# Patient Record
Sex: Male | Born: 1970 | Race: Black or African American | Hispanic: No | Marital: Single | Smoking: Former smoker
Health system: Southern US, Community
[De-identification: ages and names within clinical notes are randomized; demographics above are authoritative.]

## PROBLEM LIST (undated history)

## (undated) DIAGNOSIS — Z789 Other specified health status: Secondary | ICD-10-CM

## (undated) DIAGNOSIS — S52501A Unspecified fracture of the lower end of right radius, initial encounter for closed fracture: Secondary | ICD-10-CM

## (undated) DIAGNOSIS — N400 Enlarged prostate without lower urinary tract symptoms: Secondary | ICD-10-CM

## (undated) HISTORY — DX: Benign prostatic hyperplasia without lower urinary tract symptoms: N40.0

## (undated) HISTORY — PX: COLONOSCOPY: SHX174

## (undated) HISTORY — PX: HERNIA REPAIR: SHX51

---

## 2003-11-05 ENCOUNTER — Emergency Department (HOSPITAL_COMMUNITY): Admission: EM | Admit: 2003-11-05 | Discharge: 2003-11-05 | Payer: Self-pay | Admitting: Emergency Medicine

## 2016-05-03 ENCOUNTER — Emergency Department (HOSPITAL_COMMUNITY)
Admission: EM | Admit: 2016-05-03 | Discharge: 2016-05-04 | Disposition: A | Discharge status: Home / Self Care | Payer: BLUE CROSS/BLUE SHIELD | Attending: Emergency Medicine | Admitting: Emergency Medicine

## 2016-05-03 ENCOUNTER — Emergency Department (HOSPITAL_COMMUNITY): Payer: BLUE CROSS/BLUE SHIELD

## 2016-05-03 ENCOUNTER — Encounter (HOSPITAL_COMMUNITY): Payer: Self-pay | Admitting: Emergency Medicine

## 2016-05-03 DIAGNOSIS — F17210 Nicotine dependence, cigarettes, uncomplicated: Secondary | ICD-10-CM | POA: Diagnosis not present

## 2016-05-03 DIAGNOSIS — R1084 Generalized abdominal pain: Secondary | ICD-10-CM | POA: Diagnosis present

## 2016-05-03 DIAGNOSIS — K529 Noninfective gastroenteritis and colitis, unspecified: Secondary | ICD-10-CM | POA: Diagnosis not present

## 2016-05-03 LAB — COMPREHENSIVE METABOLIC PANEL
ALT: 20 U/L (ref 17–63)
AST: 20 U/L (ref 15–41)
Albumin: 4.8 g/dL (ref 3.5–5.0)
Alkaline Phosphatase: 65 U/L (ref 38–126)
Anion gap: 8 (ref 5–15)
BUN: 14 mg/dL (ref 6–20)
CHLORIDE: 100 mmol/L — AB (ref 101–111)
CO2: 29 mmol/L (ref 22–32)
Calcium: 9.6 mg/dL (ref 8.9–10.3)
Creatinine, Ser: 1.19 mg/dL (ref 0.61–1.24)
Glucose, Bld: 96 mg/dL (ref 65–99)
POTASSIUM: 4 mmol/L (ref 3.5–5.1)
SODIUM: 137 mmol/L (ref 135–145)
Total Bilirubin: 1 mg/dL (ref 0.3–1.2)
Total Protein: 8.1 g/dL (ref 6.5–8.1)

## 2016-05-03 LAB — CBC
HEMATOCRIT: 48.1 % (ref 39.0–52.0)
Hemoglobin: 16.5 g/dL (ref 13.0–17.0)
MCH: 31 pg (ref 26.0–34.0)
MCHC: 34.3 g/dL (ref 30.0–36.0)
MCV: 90.4 fL (ref 78.0–100.0)
Platelets: 269 10*3/uL (ref 150–400)
RBC: 5.32 MIL/uL (ref 4.22–5.81)
RDW: 12.4 % (ref 11.5–15.5)
WBC: 10.7 10*3/uL — AB (ref 4.0–10.5)

## 2016-05-03 LAB — URINALYSIS, ROUTINE W REFLEX MICROSCOPIC
Bilirubin Urine: NEGATIVE
GLUCOSE, UA: NEGATIVE mg/dL
Ketones, ur: 15 mg/dL — AB
LEUKOCYTES UA: NEGATIVE
Nitrite: NEGATIVE
PH: 6 (ref 5.0–8.0)
Protein, ur: 100 mg/dL — AB
Specific Gravity, Urine: >1.03 — ABNORMAL HIGH (ref 1.005–1.030)

## 2016-05-03 LAB — URINE MICROSCOPIC-ADD ON

## 2016-05-03 LAB — TYPE AND SCREEN
ABO/RH(D): B POS
Antibody Screen: NEGATIVE

## 2016-05-03 LAB — LIPASE, BLOOD: LIPASE: 23 U/L (ref 11–51)

## 2016-05-03 MED ORDER — CIPROFLOXACIN IN D5W 400 MG/200ML IV SOLN
400.0000 mg | Freq: Once | INTRAVENOUS | Status: AC
  Administered 2016-05-03: 400 mg via INTRAVENOUS
  Filled 2016-05-03: qty 200

## 2016-05-03 MED ORDER — DIATRIZOATE MEGLUMINE & SODIUM 66-10 % PO SOLN
ORAL | Status: AC
  Filled 2016-05-03: qty 30

## 2016-05-03 MED ORDER — IOPAMIDOL (ISOVUE-300) INJECTION 61%
100.0000 mL | Freq: Once | INTRAVENOUS | Status: AC | PRN
  Administered 2016-05-03: 100 mL via INTRAVENOUS

## 2016-05-03 MED ORDER — TRAMADOL HCL 50 MG PO TABS: 50.0000 mg | ORAL_TABLET | Freq: Four times a day (QID) | ORAL | Status: DC | PRN

## 2016-05-03 MED ORDER — DIPHENHYDRAMINE HCL 50 MG/ML IJ SOLN: 12.5000 mg | Freq: Once | INTRAMUSCULAR | Status: DC

## 2016-05-03 MED ORDER — CIPROFLOXACIN HCL 500 MG PO TABS: ORAL_TABLET | ORAL | Status: DC

## 2016-05-03 MED ORDER — ONDANSETRON 4 MG PO TBDP: ORAL_TABLET | ORAL | Status: DC

## 2016-05-03 MED ORDER — METRONIDAZOLE IN NACL 5-0.79 MG/ML-% IV SOLN
500.0000 mg | Freq: Once | INTRAVENOUS | Status: AC
  Administered 2016-05-03: 500 mg via INTRAVENOUS
  Filled 2016-05-03: qty 100

## 2016-05-03 MED ORDER — METRONIDAZOLE 500 MG PO TABS: 500.0000 mg | ORAL_TABLET | Freq: Four times a day (QID) | ORAL | Status: DC

## 2016-05-03 NOTE — ED Notes (Addendum)
Pt reports severe intermittent abdominal pain with dry heaves, diarrhea, and bright red blood in stools. Pt has hx of hemorrhoids.

## 2016-05-03 NOTE — ED Provider Notes (Signed)
CSN: [id number]     Arrival date & time 05/03/16  1651 History   First MD Initiated Contact with Patient 05/03/16 1918     Chief Complaint  Patient presents with  . Abdominal Pain     (Consider location/radiation/quality/duration/timing/severity/associated sxs/prior Treatment) Patient is a 45 y.o. male presenting with abdominal pain. The history is provided by the patient (Patient complains of abdominal pain with some nausea and bleeding per rectum).  Abdominal Pain Pain location:  Generalized Pain quality: aching   Pain radiates to:  Periumbilical region Pain severity:  Moderate Onset quality:  Gradual Timing:  Intermittent Progression:  Waxing and waning Associated symptoms: diarrhea   Associated symptoms: no chest pain, no cough, no fatigue and no hematuria     History reviewed. No pertinent past medical history. Past Surgical History  Procedure Laterality Date  . Hernia repair     No family history on file. Social History  Substance Use Topics  . Smoking status: Current Every Day Smoker -- 0.50 packs/day    Types: Cigarettes  . Smokeless tobacco: None  . Alcohol Use: No    Review of Systems  Constitutional: Negative for appetite change and fatigue.  HENT: Negative for congestion, ear discharge and sinus pressure.   Eyes: Negative for discharge.  Respiratory: Negative for cough.   Cardiovascular: Negative for chest pain.  Gastrointestinal: Positive for abdominal pain and diarrhea.  Genitourinary: Negative for frequency and hematuria.  Musculoskeletal: Negative for back pain.  Skin: Negative for rash.  Neurological: Negative for seizures and headaches.  Psychiatric/Behavioral: Negative for hallucinations.      Allergies  Penicillins  Home Medications   Prior to Admission medications   Medication Sig Start Date End Date Taking? Authorizing Provider  ciprofloxacin (CIPRO) 500 MG tablet One po bid 05/03/16   Bethann BerkshireJoseph Nolin Grell, MD  metroNIDAZOLE (FLAGYL) 500 MG  tablet Take 1 tablet (500 mg total) by mouth 4 (four) times daily. One po bid x 7 days 05/03/16   Bethann BerkshireJoseph Aymee Fomby, MD  ondansetron [facility](ZOFRAN ODT) 4 MG disintegrating tablet 4mg  ODT q4 hours prn nausea/vomit 05/03/16   Bethann BerkshireJoseph Cloma Rahrig, MD  traMADol (ULTRAM) 50 MG tablet Take 1 tablet (50 mg total) by mouth every 6 (six) hours as needed. 05/03/16   Bethann BerkshireJoseph Sir Mallis, MD   BP 141/90 mmHg  Pulse 60  Temp(Src) 98.8 F (37.1 C) (Oral)  Resp 18  Ht 5\' 8"  (1.727 m)  Wt 190 lb (86.183 kg)  BMI 28.90 kg/m2  SpO2 97% Physical Exam  Constitutional: He is oriented to person, place, and time. He appears well-developed.  HENT:  Head: Normocephalic.  Eyes: Conjunctivae and EOM are normal. No scleral icterus.  Neck: Neck supple. No thyromegaly present.  Cardiovascular: Normal rate and regular rhythm.  Exam reveals no gallop and no friction rub.   No murmur heard. Pulmonary/Chest: No stridor. He has no wheezes. He has no rales. He exhibits no tenderness.  Abdominal: He exhibits no distension. There is tenderness. There is no rebound.  Musculoskeletal: Normal range of motion. He exhibits no edema.  Lymphadenopathy:    He has no cervical adenopathy.  Neurological: He is oriented to person, place, and time. He exhibits normal muscle tone. Coordination normal.  Skin: No rash noted. No erythema.  Psychiatric: He has a normal mood and affect. His behavior is normal.    ED Course  Procedures (including critical care time) Labs Review Labs Reviewed  COMPREHENSIVE METABOLIC PANEL - Abnormal; Notable for the following:    Chloride 100 (*)  All other components within normal limits  CBC - Abnormal; Notable for the following:    WBC 10.7 (*)    All other components within normal limits  URINALYSIS, ROUTINE W REFLEX MICROSCOPIC (NOT AT [facility]) - Abnormal; Notable for the following:    Specific Gravity, Urine >1.030 (*)    Hgb urine dipstick SMALL (*)    Ketones, ur 15 (*)    Protein, ur 100 (*)    All other  components within normal limits  URINE MICROSCOPIC-ADD ON - Abnormal; Notable for the following:    Squamous Epithelial / LPF 0-5 (*)    Bacteria, UA RARE (*)    All other components within normal limits  LIPASE, BLOOD  POC OCCULT BLOOD, ED  TYPE AND SCREEN    Imaging Review Ct Abdomen Pelvis W Contrast  05/03/2016  CLINICAL DATA:  Severe intermittent abdomen pain with diarrhea and blood in the stools for few days. EXAM: CT ABDOMEN AND PELVIS WITH CONTRAST TECHNIQUE: Multidetector CT imaging of the abdomen and pelvis was performed using the standard protocol following bolus administration of intravenous contrast. CONTRAST:  ISOVUE-300 IOPAMIDOL (ISOVUE-300) INJECTION 61% COMPARISON:  None. FINDINGS: Lower chest:  No acute findings. Hepatobiliary: The liver and gallbladder are normal. No masses or other significant abnormality. Pancreas: No mass, inflammatory changes, or other significant abnormality. Spleen: Within normal limits in size and appearance. Adrenals/Urinary Tract: The adrenal glands and kidneys normal. Partial decompressed bladder limits evaluation of bladder. No masses identified. No evidence of hydronephrosis. Stomach/Bowel: There is diffuse bowel wall thickening of the left transverse colon extending to the distal descending colon with surrounding inflammation. There is no small bowel obstruction. The appendix normal. There is diverticulosis of colon. Vascular/Lymphatic: No pathologically enlarged lymph nodes. No evidence of abdominal aortic aneurysm. Reproductive: No mass or other significant abnormality. Other: None. Musculoskeletal: There is focal sclerosis of the left ilium near the sacroiliac joint, possible bone island. IMPRESSION: Findings consistent with colitis involving the left transverse colon extending to the distal descending colon. Electronically Signed   By: Sherian Rein M.D.   On: 05/03/2016 21:18   I have personally reviewed and evaluated these images and lab  results as part of my medical decision-making.   EKG Interpretation None      MDM   Final diagnoses:  Colitis    CT scan shows colitis. Patient nontoxic. Patient will be discharged home to follow-up with the GI doctor. He will be placed on Cipro and Flagyl also given Ultram and Zofran    Bethann Berkshire, MD 05/03/16 2148

## 2016-05-03 NOTE — Discharge Instructions (Signed)
Follow-up in 3 days for recheck with either the gastroenterologist or if you can't be seen by that Dr. then follow-up with your family doctor. And then follow-up with a gastroenterologist next week

## 2016-06-18 ENCOUNTER — Emergency Department (HOSPITAL_COMMUNITY)
Admission: EM | Admit: 2016-06-18 | Discharge: 2016-06-18 | Disposition: A | Discharge status: Home / Self Care | Payer: BLUE CROSS/BLUE SHIELD | Attending: Emergency Medicine | Admitting: Emergency Medicine

## 2016-06-18 ENCOUNTER — Emergency Department (HOSPITAL_COMMUNITY): Payer: BLUE CROSS/BLUE SHIELD

## 2016-06-18 ENCOUNTER — Encounter (HOSPITAL_COMMUNITY): Payer: Self-pay | Admitting: Emergency Medicine

## 2016-06-18 DIAGNOSIS — S70311A Abrasion, right thigh, initial encounter: Secondary | ICD-10-CM | POA: Diagnosis not present

## 2016-06-18 DIAGNOSIS — S80811A Abrasion, right lower leg, initial encounter: Secondary | ICD-10-CM | POA: Insufficient documentation

## 2016-06-18 DIAGNOSIS — F17210 Nicotine dependence, cigarettes, uncomplicated: Secondary | ICD-10-CM | POA: Diagnosis not present

## 2016-06-18 DIAGNOSIS — Y92410 Unspecified street and highway as the place of occurrence of the external cause: Secondary | ICD-10-CM | POA: Diagnosis not present

## 2016-06-18 DIAGNOSIS — S40011A Contusion of right shoulder, initial encounter: Secondary | ICD-10-CM | POA: Insufficient documentation

## 2016-06-18 DIAGNOSIS — S92912A Unspecified fracture of left toe(s), initial encounter for closed fracture: Secondary | ICD-10-CM

## 2016-06-18 DIAGNOSIS — Y9355 Activity, bike riding: Secondary | ICD-10-CM | POA: Insufficient documentation

## 2016-06-18 DIAGNOSIS — S92415A Nondisplaced fracture of proximal phalanx of left great toe, initial encounter for closed fracture: Secondary | ICD-10-CM | POA: Insufficient documentation

## 2016-06-18 DIAGNOSIS — S52501A Unspecified fracture of the lower end of right radius, initial encounter for closed fracture: Secondary | ICD-10-CM

## 2016-06-18 DIAGNOSIS — T1490XA Injury, unspecified, initial encounter: Secondary | ICD-10-CM

## 2016-06-18 DIAGNOSIS — S6991XA Unspecified injury of right wrist, hand and finger(s), initial encounter: Secondary | ICD-10-CM | POA: Diagnosis present

## 2016-06-18 DIAGNOSIS — S62639A Displaced fracture of distal phalanx of unspecified finger, initial encounter for closed fracture: Secondary | ICD-10-CM

## 2016-06-18 DIAGNOSIS — S62634A Displaced fracture of distal phalanx of right ring finger, initial encounter for closed fracture: Secondary | ICD-10-CM | POA: Diagnosis not present

## 2016-06-18 DIAGNOSIS — S7012XA Contusion of left thigh, initial encounter: Secondary | ICD-10-CM | POA: Diagnosis not present

## 2016-06-18 DIAGNOSIS — Y999 Unspecified external cause status: Secondary | ICD-10-CM | POA: Insufficient documentation

## 2016-06-18 DIAGNOSIS — Z23 Encounter for immunization: Secondary | ICD-10-CM | POA: Diagnosis not present

## 2016-06-18 MED ORDER — MORPHINE SULFATE (PF) 4 MG/ML IV SOLN
6.0000 mg | Freq: Once | INTRAVENOUS | Status: AC
Start: 2016-06-18 — End: 2016-06-18
  Administered 2016-06-18: 6 mg via INTRAVENOUS
  Filled 2016-06-18: qty 2

## 2016-06-18 MED ORDER — TETANUS-DIPHTH-ACELL PERTUSSIS 5-2.5-18.5 LF-MCG/0.5 IM SUSP
0.5000 mL | Freq: Once | INTRAMUSCULAR | Status: AC
  Administered 2016-06-18: 0.5 mL via INTRAMUSCULAR
  Filled 2016-06-18: qty 0.5

## 2016-06-18 MED ORDER — HYDROCODONE-ACETAMINOPHEN 5-325 MG PO TABS
1.0000 | ORAL_TABLET | Freq: Once | ORAL | Status: AC
  Administered 2016-06-18: 1 via ORAL
  Filled 2016-06-18: qty 1

## 2016-06-18 MED ORDER — HYDROCODONE-ACETAMINOPHEN 5-325 MG PO TABS: 1.0000 | ORAL_TABLET | Freq: Four times a day (QID) | ORAL | 0 refills | Status: DC | PRN

## 2016-06-18 MED ORDER — SILVER SULFADIAZINE 1 % EX CREA
TOPICAL_CREAM | Freq: Once | CUTANEOUS | Status: AC
  Administered 2016-06-18: 13:00:00 via TOPICAL
  Filled 2016-06-18: qty 50

## 2016-06-18 NOTE — ED Notes (Signed)
Pt in Radiology 

## 2016-06-18 NOTE — ED Notes (Signed)
Call received from Radiology, pt having a lot of L ankle pain when being moved. Order placed for L ankle DG per protocol.
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
## 2016-06-18 NOTE — Discharge Instructions (Signed)
-  Call Dr. Mina MarbleWeingold (info above) at [facility] Monday morning, speak with Helmut Musterlicia about whether or not you will have surgery Monday or Wednesday.  -Do not eat anything after midnignt on sunday.

## 2016-06-18 NOTE — ED Notes (Signed)
Pt still in Radiology, family waiting in room.
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
## 2016-06-21 ENCOUNTER — Other Ambulatory Visit: Payer: Self-pay | Admitting: Orthopedic Surgery

## 2016-06-21 ENCOUNTER — Encounter (HOSPITAL_BASED_OUTPATIENT_CLINIC_OR_DEPARTMENT_OTHER): Payer: Self-pay | Admitting: *Deleted

## 2016-06-22 ENCOUNTER — Ambulatory Visit (HOSPITAL_BASED_OUTPATIENT_CLINIC_OR_DEPARTMENT_OTHER)
Admission: RE | Admit: 2016-06-22 | Discharge: 2016-06-22 | Disposition: A | Discharge status: Home / Self Care | Payer: BLUE CROSS/BLUE SHIELD | Source: Ambulatory Visit | Attending: Orthopedic Surgery | Admitting: Orthopedic Surgery

## 2016-06-22 ENCOUNTER — Ambulatory Visit (HOSPITAL_BASED_OUTPATIENT_CLINIC_OR_DEPARTMENT_OTHER): Payer: BLUE CROSS/BLUE SHIELD | Admitting: Certified Registered"

## 2016-06-22 ENCOUNTER — Encounter (HOSPITAL_BASED_OUTPATIENT_CLINIC_OR_DEPARTMENT_OTHER)
Admission: RE | Disposition: A | Discharge status: Home / Self Care | Payer: Self-pay | Source: Ambulatory Visit | Attending: Orthopedic Surgery

## 2016-06-22 ENCOUNTER — Encounter (HOSPITAL_BASED_OUTPATIENT_CLINIC_OR_DEPARTMENT_OTHER): Payer: Self-pay | Admitting: Certified Registered"

## 2016-06-22 DIAGNOSIS — S52571A Other intraarticular fracture of lower end of right radius, initial encounter for closed fracture: Secondary | ICD-10-CM | POA: Insufficient documentation

## 2016-06-22 DIAGNOSIS — F17210 Nicotine dependence, cigarettes, uncomplicated: Secondary | ICD-10-CM | POA: Diagnosis not present

## 2016-06-22 DIAGNOSIS — Z880 Allergy status to penicillin: Secondary | ICD-10-CM | POA: Insufficient documentation

## 2016-06-22 DIAGNOSIS — G5601 Carpal tunnel syndrome, right upper limb: Secondary | ICD-10-CM | POA: Insufficient documentation

## 2016-06-22 DIAGNOSIS — W19XXXA Unspecified fall, initial encounter: Secondary | ICD-10-CM | POA: Diagnosis not present

## 2016-06-22 HISTORY — PX: CARPAL TUNNEL RELEASE: SHX101

## 2016-06-22 HISTORY — PX: OPEN REDUCTION INTERNAL FIXATION (ORIF) DISTAL RADIAL FRACTURE: SHX5989

## 2016-06-22 HISTORY — DX: Unspecified fracture of the lower end of right radius, initial encounter for closed fracture: S52.501A

## 2016-06-22 SURGERY — OPEN REDUCTION INTERNAL FIXATION (ORIF) DISTAL RADIUS FRACTURE
Anesthesia: Regional | Site: Wrist | Laterality: Right

## 2016-06-22 MED ORDER — ATROPINE SULFATE 0.4 MG/ML IJ SOLN
INTRAMUSCULAR | Status: AC
  Filled 2016-06-22: qty 1

## 2016-06-22 MED ORDER — BUPIVACAINE-EPINEPHRINE (PF) 0.5% -1:200000 IJ SOLN
INTRAMUSCULAR | Status: DC | PRN
  Administered 2016-06-22: 30 mL via PERINEURAL

## 2016-06-22 MED ORDER — HYDROCODONE-ACETAMINOPHEN 7.5-325 MG PO TABS: 1.0000 | ORAL_TABLET | Freq: Once | ORAL | Status: DC | PRN

## 2016-06-22 MED ORDER — FENTANYL CITRATE (PF) 100 MCG/2ML IJ SOLN
50.0000 ug | INTRAMUSCULAR | Status: DC | PRN
  Administered 2016-06-22: 100 ug via INTRAVENOUS

## 2016-06-22 MED ORDER — MIDAZOLAM HCL 2 MG/2ML IJ SOLN
INTRAMUSCULAR | Status: AC
  Filled 2016-06-22: qty 2

## 2016-06-22 MED ORDER — ONDANSETRON HCL 4 MG/2ML IJ SOLN
INTRAMUSCULAR | Status: DC | PRN
Start: 2016-06-22 — End: 2016-06-22
  Administered 2016-06-22: 4 mg via INTRAVENOUS

## 2016-06-22 MED ORDER — LIDOCAINE 2% (20 MG/ML) 5 ML SYRINGE
INTRAMUSCULAR | Status: DC | PRN
  Administered 2016-06-22: 20 mg via INTRAVENOUS

## 2016-06-22 MED ORDER — FENTANYL CITRATE (PF) 100 MCG/2ML IJ SOLN
INTRAMUSCULAR | Status: AC
  Filled 2016-06-22: qty 2

## 2016-06-22 MED ORDER — SCOPOLAMINE 1 MG/3DAYS TD PT72: 1.0000 | MEDICATED_PATCH | Freq: Once | TRANSDERMAL | Status: DC | PRN

## 2016-06-22 MED ORDER — CHLORHEXIDINE GLUCONATE 4 % EX LIQD: 60.0000 mL | Freq: Once | CUTANEOUS | Status: DC

## 2016-06-22 MED ORDER — DEXAMETHASONE SODIUM PHOSPHATE 10 MG/ML IJ SOLN
INTRAMUSCULAR | Status: DC | PRN
  Administered 2016-06-22: 10 mg via INTRAVENOUS

## 2016-06-22 MED ORDER — PROPOFOL 10 MG/ML IV BOLUS
INTRAVENOUS | Status: DC | PRN
  Administered 2016-06-22: 200 mg via INTRAVENOUS

## 2016-06-22 MED ORDER — GLYCOPYRROLATE 0.2 MG/ML IJ SOLN: 0.2000 mg | Freq: Once | INTRAMUSCULAR | Status: DC | PRN

## 2016-06-22 MED ORDER — KETOROLAC TROMETHAMINE 30 MG/ML IJ SOLN: 30.0000 mg | Freq: Once | INTRAMUSCULAR | Status: DC | PRN

## 2016-06-22 MED ORDER — LIDOCAINE 2% (20 MG/ML) 5 ML SYRINGE
INTRAMUSCULAR | Status: AC
  Filled 2016-06-22: qty 5

## 2016-06-22 MED ORDER — HYDROMORPHONE HCL 1 MG/ML IJ SOLN: 0.2500 mg | INTRAMUSCULAR | Status: DC | PRN

## 2016-06-22 MED ORDER — ONDANSETRON HCL 4 MG/2ML IJ SOLN
INTRAMUSCULAR | Status: AC
  Filled 2016-06-22: qty 2

## 2016-06-22 MED ORDER — OXYCODONE-ACETAMINOPHEN 5-325 MG PO TABS: 1.0000 | ORAL_TABLET | ORAL | 0 refills | Status: DC | PRN

## 2016-06-22 MED ORDER — MIDAZOLAM HCL 2 MG/2ML IJ SOLN
1.0000 mg | INTRAMUSCULAR | Status: DC | PRN
  Administered 2016-06-22: 2 mg via INTRAVENOUS

## 2016-06-22 MED ORDER — VANCOMYCIN HCL IN DEXTROSE 1-5 GM/200ML-% IV SOLN
1000.0000 mg | INTRAVENOUS | Status: AC
  Administered 2016-06-22: 1000 mg via INTRAVENOUS

## 2016-06-22 MED ORDER — PROMETHAZINE HCL 25 MG/ML IJ SOLN: 6.2500 mg | INTRAMUSCULAR | Status: DC | PRN

## 2016-06-22 MED ORDER — PROPOFOL 500 MG/50ML IV EMUL
INTRAVENOUS | Status: AC
  Filled 2016-06-22: qty 50

## 2016-06-22 MED ORDER — VANCOMYCIN HCL IN DEXTROSE 1-5 GM/200ML-% IV SOLN
INTRAVENOUS | Status: AC
  Filled 2016-06-22: qty 200

## 2016-06-22 MED ORDER — LACTATED RINGERS IV SOLN
INTRAVENOUS | Status: DC
  Administered 2016-06-22 (×3): via INTRAVENOUS

## 2016-06-22 MED ORDER — DEXAMETHASONE SODIUM PHOSPHATE 10 MG/ML IJ SOLN
INTRAMUSCULAR | Status: AC
  Filled 2016-06-22: qty 1

## 2016-06-22 SURGICAL SUPPLY — 75 items
APL SKNCLS STERI-STRIP NONHPOA (GAUZE/BANDAGES/DRESSINGS) ×1
BANDAGE ACE 3X5.8 VEL STRL LF (GAUZE/BANDAGES/DRESSINGS) IMPLANT
BANDAGE ACE 4X5 VEL STRL LF (GAUZE/BANDAGES/DRESSINGS) ×2 IMPLANT
BENZOIN TINCTURE PRP APPL 2/3 (GAUZE/BANDAGES/DRESSINGS) ×2 IMPLANT
BIT DRILL 2 FAST STEP (BIT) ×2 IMPLANT
BIT DRILL 2.5X4 QC (BIT) ×2 IMPLANT
BLADE MINI RND TIP GREEN BEAV (BLADE) IMPLANT
BLADE SURG 15 STRL LF DISP TIS (BLADE) ×2 IMPLANT
BLADE SURG 15 STRL SS (BLADE) ×4
BNDG CMPR 9X4 STRL LF SNTH (GAUZE/BANDAGES/DRESSINGS) ×1
BNDG ESMARK 4X9 LF (GAUZE/BANDAGES/DRESSINGS) ×2 IMPLANT
BNDG GAUZE ELAST 4 BULKY (GAUZE/BANDAGES/DRESSINGS) ×2 IMPLANT
CANISTER SUCT 1200ML W/VALVE (MISCELLANEOUS) IMPLANT
CORDS BIPOLAR (ELECTRODE) ×2 IMPLANT
COVER BACK TABLE 60X90IN (DRAPES) ×2 IMPLANT
CUFF TOURNIQUET SINGLE 18IN (TOURNIQUET CUFF) ×2 IMPLANT
DECANTER SPIKE VIAL GLASS SM (MISCELLANEOUS) IMPLANT
DRAPE EXTREMITY T 121X128X90 (DRAPE) ×2 IMPLANT
DRAPE OEC MINIVIEW 54X84 (DRAPES) ×2 IMPLANT
DRAPE SURG 17X23 STRL (DRAPES) ×2 IMPLANT
DURAPREP 26ML APPLICATOR (WOUND CARE) ×2 IMPLANT
ELECT REM PT RETURN 9FT ADLT (ELECTROSURGICAL)
ELECTRODE REM PT RTRN 9FT ADLT (ELECTROSURGICAL) IMPLANT
GAUZE SPONGE 4X4 12PLY STRL (GAUZE/BANDAGES/DRESSINGS) ×2 IMPLANT
GAUZE SPONGE 4X4 16PLY XRAY LF (GAUZE/BANDAGES/DRESSINGS) IMPLANT
GAUZE XEROFORM 1X8 LF (GAUZE/BANDAGES/DRESSINGS) IMPLANT
GLOVE BIO SURGEON STRL SZ 6.5 (GLOVE) ×2 IMPLANT
GLOVE BIOGEL M STRL SZ7.5 (GLOVE) ×2 IMPLANT
GLOVE BIOGEL PI IND STRL 7.0 (GLOVE) ×2 IMPLANT
GLOVE BIOGEL PI INDICATOR 7.0 (GLOVE) ×2
GLOVE SURG SYN 8.0 (GLOVE) ×4 IMPLANT
GOWN STRL REUS W/ TWL LRG LVL3 (GOWN DISPOSABLE) ×1 IMPLANT
GOWN STRL REUS W/TWL LRG LVL3 (GOWN DISPOSABLE) ×2
GOWN STRL REUS W/TWL XL LVL3 (GOWN DISPOSABLE) ×4 IMPLANT
NEEDLE HYPO 25X1 1.5 SAFETY (NEEDLE) ×2 IMPLANT
NS IRRIG 1000ML POUR BTL (IV SOLUTION) ×2 IMPLANT
PACK BASIN DAY SURGERY FS (CUSTOM PROCEDURE TRAY) ×2 IMPLANT
PAD CAST 3X4 CTTN HI CHSV (CAST SUPPLIES) ×1 IMPLANT
PAD CAST 4YDX4 CTTN HI CHSV (CAST SUPPLIES) IMPLANT
PADDING CAST ABS 4INX4YD NS (CAST SUPPLIES) ×1
PADDING CAST ABS COTTON 4X4 ST (CAST SUPPLIES) ×1 IMPLANT
PADDING CAST COTTON 3X4 STRL (CAST SUPPLIES) ×2
PADDING CAST COTTON 4X4 STRL (CAST SUPPLIES)
PEG SUBCHONDRAL SMOOTH 2.0X22 (Peg) ×12 IMPLANT
PEG SUBCHONDRAL SMOOTH 2.0X24 (Peg) ×2 IMPLANT
PENCIL BUTTON HOLSTER BLD 10FT (ELECTRODE) IMPLANT
PLATE STAN 24.4X59.5 RT (Plate) ×2 IMPLANT
SCREW CORT 3.5X14 LNG (Screw) ×2 IMPLANT
SCREW CORT 3.5X16 LNG (Screw) ×4 IMPLANT
SHEET MEDIUM DRAPE 40X70 STRL (DRAPES) ×2 IMPLANT
SLEEVE SCD COMPRESS KNEE MED (MISCELLANEOUS) ×2 IMPLANT
SPLINT PLASTER CAST XFAST 3X15 (CAST SUPPLIES) IMPLANT
SPLINT PLASTER CAST XFAST 4X15 (CAST SUPPLIES) ×5 IMPLANT
SPLINT PLASTER XTRA FAST SET 4 (CAST SUPPLIES) ×5
SPLINT PLASTER XTRA FASTSET 3X (CAST SUPPLIES)
STOCKINETTE 4X48 STRL (DRAPES) ×2 IMPLANT
STRIP CLOSURE SKIN 1/2X4 (GAUZE/BANDAGES/DRESSINGS) ×2 IMPLANT
SUCTION FRAZIER HANDLE 10FR (MISCELLANEOUS)
SUCTION TUBE FRAZIER 10FR DISP (MISCELLANEOUS) IMPLANT
SUT CHROMIC 3 0 PS 2 (SUTURE) IMPLANT
SUT ETHILON 4 0 PS 2 18 (SUTURE) IMPLANT
SUT MERSILENE 4 0 P 3 (SUTURE) IMPLANT
SUT PROLENE 3 0 PS 2 (SUTURE) ×2 IMPLANT
SUT VIC AB 0 SH 27 (SUTURE) IMPLANT
SUT VIC AB 2-0 SH 27 (SUTURE) ×2
SUT VIC AB 2-0 SH 27XBRD (SUTURE) ×1 IMPLANT
SUT VIC AB 3-0 FS2 27 (SUTURE) IMPLANT
SUT VIC AB 4-0 RB1 18 (SUTURE) IMPLANT
SUT VICRYL RAPIDE 4-0 (SUTURE) IMPLANT
SUT VICRYL RAPIDE 4/0 PS 2 (SUTURE) IMPLANT
SYR BULB 3OZ (MISCELLANEOUS) ×2 IMPLANT
SYRINGE 10CC LL (SYRINGE) ×2 IMPLANT
TOWEL OR 17X24 6PK STRL BLUE (TOWEL DISPOSABLE) ×2 IMPLANT
TUBE CONNECTING 20X1/4 (TUBING) IMPLANT
UNDERPAD 30X30 (UNDERPADS AND DIAPERS) ×2 IMPLANT

## 2016-06-22 NOTE — Anesthesia Procedure Notes (Signed)
Procedure Name: LMA Insertion Date/Time: 06/22/2016 8:33 AM Performed by: Curly ShoresRAFT, Davidmichael Zarazua W Pre-anesthesia Checklist: Patient identified, Emergency Drugs available, Suction available and Patient being monitored Patient Re-evaluated:Patient Re-evaluated prior to inductionOxygen Delivery Method: Circle system utilized Preoxygenation: Pre-oxygenation with 100% oxygen Intubation Type: IV induction Ventilation: Mask ventilation without difficulty LMA: LMA inserted LMA Size: 5.0 Number of attempts: 1 Airway Equipment and Method: Bite block Placement Confirmation: positive ETCO2 and breath sounds checked- equal and bilateral Tube secured with: Tape Dental Injury: Teeth and Oropharynx as per pre-operative assessment
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
## 2016-06-22 NOTE — Discharge Instructions (Signed)
° ° °  Regional Anesthesia Blocks ° °1. Numbness or the inability to move the "blocked" extremity may last from 3-48 hours after placement. The length of time depends on the medication injected and your individual response to the medication. If the numbness is not going away after 48 hours, call your surgeon. ° °2. The extremity that is blocked will need to be protected until the numbness is gone and the  Strength has returned. Because you cannot feel it, you will need to take extra care to avoid injury. Because it may be weak, you may have difficulty moving it or using it. You may not know what position it is in without looking at it while the block is in effect. ° °3. For blocks in the legs and feet, returning to weight bearing and walking needs to be done carefully. You will need to wait until the numbness is entirely gone and the strength has returned. You should be able to move your leg and foot normally before you try and bear weight or walk. You will need someone to be with you when you first try to ensure you do not fall and possibly risk injury. ° °4. Bruising and tenderness at the needle site are common side effects and will resolve in a few days. ° °5. Persistent numbness or new problems with movement should be communicated to the surgeon or the [facility] ([phone])/ [facility] ([phone]). ° ° ° °Post Anesthesia Home Care Instructions ° °Activity: °Get plenty of rest for the remainder of the day. A responsible adult should stay with you for 24 hours following the procedure.  °For the next 24 hours, DO NOT: °-Drive a car °-Operate machinery °-Drink alcoholic beverages °-Take any medication unless instructed by your physician °-Make any legal decisions or sign important papers. ° °Meals: °Start with liquid foods such as gelatin or soup. Progress to regular foods as tolerated. Avoid greasy, spicy, heavy foods. If nausea and/or vomiting occur, drink only clear liquids until  the nausea and/or vomiting subsides. Call your physician if vomiting continues. ° °Special Instructions/Symptoms: °Your throat may feel dry or sore from the anesthesia or the breathing tube placed in your throat during surgery. If this causes discomfort, gargle with warm salt water. The discomfort should disappear within 24 hours. ° °If you had a scopolamine patch placed behind your ear for the management of post- operative nausea and/or vomiting: ° °1. The medication in the patch is effective for 72 hours, after which it should be removed.  Wrap patch in a tissue and discard in the trash. Wash hands thoroughly with soap and water. °2. You may remove the patch earlier than 72 hours if you experience unpleasant side effects which may include dry mouth, dizziness or visual disturbances. °3. Avoid touching the patch. Wash your hands with soap and water after contact with the patch. °  ° °

## 2016-06-22 NOTE — Anesthesia Preprocedure Evaluation (Addendum)
Anesthesia Evaluation  Patient identified by MRN, date of birth, ID band Patient awake    Reviewed: Allergy & Precautions, NPO status , Patient's Chart, lab work & pertinent test results  Airway Mallampati: II  TM Distance: >3 FB     Dental   Pulmonary Current Smoker,    Pulmonary exam normal        Cardiovascular negative cardio ROS Normal cardiovascular exam     Neuro/Psych negative neurological ROS     GI/Hepatic negative GI ROS, Neg liver ROS,   Endo/Other  negative endocrine ROS  Renal/GU negative Renal ROS     Musculoskeletal   Abdominal   Peds  Hematology negative hematology ROS (+)   Anesthesia Other Findings   Reproductive/Obstetrics                            Anesthesia Physical Anesthesia Plan  ASA: I  Anesthesia Plan: General and Regional   Post-op Pain Management: GA combined w/ Regional for post-op pain   Induction: Intravenous  Airway Management Planned: LMA  Additional Equipment:   Intra-op Plan:   Post-operative Plan: Extubation in OR  Informed Consent: I have reviewed the patients History and Physical, chart, labs and discussed the procedure including the risks, benefits and alternatives for the proposed anesthesia with the patient or authorized representative who has indicated his/her understanding and acceptance.   Dental advisory given  Plan Discussed with: CRNA  Anesthesia Plan Comments:         Anesthesia Quick Evaluation

## 2016-06-22 NOTE — Transfer of Care (Signed)
Immediate Anesthesia Transfer of Care Note  Patient: Ileana LaddWalter R Brethauer  Procedure(s) Performed: Procedure(s) with comments: OPEN REDUCTION INTERNAL FIXATION (ORIF) DISTAL RADIAL FRACTURE,right (Right) - Axillary block CARPAL TUNNEL RELEASE,right (Right)  Patient Location: PACU  Anesthesia Type:GA combined with regional for post-op pain  Level of Consciousness: awake, sedated and responds to stimulation  Airway & Oxygen Therapy: Patient Spontanous Breathing and Patient connected to face mask oxygen  Post-op Assessment: Report given to RN, Post -op Vital signs reviewed and stable and Patient moving all extremities  Post vital signs: Reviewed and stable  Last Vitals:  Vitals:   06/22/16 0818 06/22/16 0819  BP:    Pulse: 74 91  Resp: 18 17  Temp:      Last Pain:  Vitals:   06/22/16 0747  PainSc: 4       Patients Stated Pain Goal: 2 (06/22/16 0747)  Complications: No apparent anesthesia complications

## 2016-06-22 NOTE — H&P (Signed)
Charles Delgado is an 45 y.o. male.   Chief Complaint: Right wrist pain with intermittent numbness and tingling in the median distribution. HPI: Patient is a very pleasant 45 year old right-hand-dominant male status post fall with displaced intra-articular fracture right distal radius.  Past Medical History:  Diagnosis Date  . Distal radius fracture, right     Past Surgical History:  Procedure Laterality Date  . HERNIA REPAIR     as baby    History reviewed. No pertinent family history. Social History:  reports that he has been smoking Cigarettes.  He has been smoking about 0.50 packs per day. He has never used smokeless tobacco. He reports that he does not drink alcohol or use drugs.  Allergies:  Allergies  Allergen Reactions  . Penicillins Hives    Has patient had a PCN reaction causing immediate rash, facial/tongue/throat swelling, SOB or lightheadedness with hypotension: Yes Has patient had a PCN reaction causing severe rash involving mucus membranes or skin necrosis: No Has patient had a PCN reaction that required hospitalization No Has patient had a PCN reaction occurring within the last 10 years: No If all of the above answers are "NO", then may proceed with Cephalosporin use.     Medications Prior to Admission  Medication Sig Dispense Refill  . HYDROcodone-acetaminophen (NORCO/VICODIN) 5-325 MG tablet Take 1-2 tablets by mouth every 6 (six) hours as needed for severe pain. 30 tablet 0    No results found for this or any previous visit (from the past 48 hour(s)). No results found.  Review of Systems  All other systems reviewed and are negative.   Blood pressure (!) 122/92, pulse 85, temperature 98 F (36.7 C), resp. rate 18, height 5\' 8"  (1.727 m), weight 88.5 kg (195 lb 2 oz), SpO2 97 %. Physical Exam  Constitutional: He is oriented to person, place, and time. He appears well-developed and well-nourished.  HENT:  Head: Normocephalic and atraumatic.  Neck:  Normal range of motion.  Cardiovascular: Normal rate.   Respiratory: Effort normal.  Musculoskeletal:       Right wrist: He exhibits tenderness, bony tenderness, swelling and deformity.  Displaced intra-articular fracture right distal radius.  Neurological: He is alert and oriented to person, place, and time.  Skin: Skin is warm.  Psychiatric: He has a normal mood and affect. His behavior is normal. Judgment and thought content normal.     Assessment/Plan Impression is a 45 year old male status post fall with displaced intra-articular fracture distal radius right side with carpal tunnel syndrome. We have discussed internal fixation with release of median nerve at the wrist on the right. We will proceed as per patient's request.  Marlowe ShoresWEINGOLD,Justinn Welter A, MD 06/22/2016, 7:51 AM

## 2016-06-22 NOTE — Op Note (Signed)
Seen note [phone]

## 2016-06-22 NOTE — Op Note (Signed)
NAME:  Charles Delgado, Charles Delgado               ACCOUNT NO.:  [id number]  MEDICAL RECORD NO.:  [id number]  LOCATION:                                 FACILITY:  PHYSICIAN:  Artist PaisMatthew A. Gearldine Looney, M.D.DATE OF BIRTH:  31-Oct-1970  DATE OF PROCEDURE:  06/22/2016 DATE OF DISCHARGE:                              OPERATIVE REPORT   PREOPERATIVE DIAGNOSIS:  Displaced intra-articular 3-part fracture of the distal radius on the right side.  POSTOPERATIVE DIAGNOSIS:  Displaced intra-articular 3-part fracture of the distal radius on the right side.  PROCEDURE:  Open reduction and internal fixation, right distal radius fracture with standard DVR plate and screws and loose of median nerve at right wrist.  SURGEON:  Artist PaisMatthew A. Mina MarbleWeingold, MD.  ASSISTANT:  Marveen Reeksobert J. Dasnoit, PA.  ANESTHESIA:  Axillary block and general.  COMPLICATIONS:  No complication.  DRAINS:  No drains.  DESCRIPTION OF PROCEDURE:  The patient was taken to the operating suite after induction of adequate axillary block analgesia and then general laryngeal mask airway general anesthetic.  The right upper extremity was prepped and draped in usual sterile fashion.  An Esmarch was used to exsanguinate the limb.  Tourniquet was inflated to 250 mmHg.  At this point in time, an incision made over the palmar aspect of the right hand and wrist area centered over the flexor carpi radialis tendon.  Skin incised sharply and dissection was carried down the sheath overlying the FCR.  This was incised.  The FCR was retracted to the midline.  The radial artery to lateral side.  The fascia was incised.  Dissection was carried down to the level of pronator quadratus.  This was subperiosteal stripped off the distal radius revealing a 3-part intra-articular fracture of the distal radius.  We released brachioradialis off the distal fragment radially to aid in reduction.  Reduction was performed with longitudinal traction, flexion, and ulnar deviation.  Once  we have achieved reduction, standard DVR plate was fastened to the volar aspect, distal radius and we used the fluoroscopy to determine adequate plate position.  Once this was done, 2 cortical screws were placed proximally for a total of 3 followed by the smooth pegs distally.  Intraoperative fluoroscopy revealed adequate reduction in AP, lateral, and oblique view.  We then identified the median nerve in the proximal aspect and tracing the edge of the transverse carpal ligament.  We carefully identified and retracted the palmar cutaneous branch of the median nerve.  We used a Therapist, nutritionalreer elevator to a created a path dorsal and volar to the transverse carpal ligament.  Then under direct vision, we divided from proximal to distal.  The wound was then thoroughly irrigated.  We loosely closed the pronator quadratus over the plate using 2-0 undyed Vicryl subcutaneously and a 3-0 Prolene subcuticular stitch on the skin.  Steri-Strips, 4 x 4, fluffs, and a volar splint was applied.  The patient tolerated the procedure well and went to recovery room in stable fashion.     Artist PaisMatthew A. Mina MarbleWeingold, M.D.   ______________________________ Artist PaisMatthew A. Mina MarbleWeingold, M.D.    MAW/MEDQ  D:  06/22/2016  T:  06/22/2016  Job:  [id number]

## 2016-06-22 NOTE — Anesthesia Procedure Notes (Signed)
Anesthesia Regional Block:  Axillary brachial plexus block  Pre-Anesthetic Checklist: ,, timeout performed, Correct Patient, Correct Site, Correct Laterality, Correct Procedure, Correct Position, site marked, Risks and benefits discussed,  Surgical consent,  Pre-op evaluation,  At surgeon's request and post-op pain management  Laterality: Right  Prep: chloraprep       Needles:   Needle Type: Echogenic Stimulator Needle     Needle Length: 9cm 9 cm Needle Gauge: 21 and 21 G    Additional Needles:  Procedures: ultrasound guided (picture in chart) and nerve stimulator Axillary brachial plexus block  Nerve Stimulator or Paresthesia:  Response: biceps, triceps, finger flexion, 0.5 mA,   Additional Responses:   Narrative:  Start time: 06/22/2016 8:05 AM End time: 06/22/2016 8:12 AM Injection made incrementally with aspirations every 5 mL.  Events: blood aspirated  Performed by: Personally  Anesthesiologist: Marcene DuosFITZGERALD, Severino Paolo

## 2016-06-22 NOTE — Progress Notes (Signed)
Assisted Dr. Rob Fitzgerald with right, ultrasound guided, axillary block. Side rails up, monitors on throughout procedure. See vital signs in flow sheet. Tolerated Procedure well. 

## 2016-06-23 ENCOUNTER — Encounter (HOSPITAL_BASED_OUTPATIENT_CLINIC_OR_DEPARTMENT_OTHER): Payer: Self-pay | Admitting: Orthopedic Surgery

## 2016-06-24 NOTE — Op Note (Signed)
NAME:  Charles SartoriusCOURTS, Bear               ACCOUNT NO.:  [id number]  MEDICAL RECORD NO.:  [id number]  LOCATION:                                 FACILITY:  PHYSICIAN:  Artist PaisMatthew A. Ashar Lewinski, M.D.DATE OF BIRTH:  08-23-1971  DATE OF PROCEDURE:  06/22/2016 DATE OF DISCHARGE:                              OPERATIVE REPORT   ADDENDUM:  PREOPERATIVE DIAGNOSIS:  Diagnosis displaced intra-articular 3-part fracture distal radius right side with right median nerve compression and acute carpal tunnel syndrome.  POSTOPERATIVE DIAGNOSIS:  Diagnosis displaced intra-articular 3-part fracture distal radius right side with right median nerve compression and acute carpal tunnel syndrome.  PROCEDURE:  __________     Artist PaisMatthew A. Mina MarbleWeingold, M.D.   ______________________________ Artist PaisMatthew A. Mina MarbleWeingold, M.D.    MAW/MEDQ  D:  06/23/2016  T:  06/24/2016  Job:  [id number]

## 2016-06-29 NOTE — Op Note (Deleted)
  The note originally documented on this encounter has been moved the the encounter in which it belongs.  

## 2016-06-29 NOTE — Op Note (Signed)
NAME:  Charles Delgado, Charles Delgado               ACCOUNT NO.:  [id number]  MEDICAL RECORD NO.:  [id number]  LOCATION:                                 FACILITY:  PHYSICIAN:  Artist PaisMatthew A. Oscar Forman, M.D.DATE OF BIRTH:  04/02/1971  DATE OF PROCEDURE:  06/22/2016 DATE OF DISCHARGE:  06/22/2016                              OPERATIVE REPORT   ADDENDUM:  This is a late entry for 06/22/2016.  The op report should state:  PREOPERATIVE DIAGNOSIS:  Displaced intra-articular fracture, three-part distal radius fracture with right-sided median nerve compression, acute carpal tunnel syndrome.  POSTOPERATIVE DIAGNOSIS:  Displaced intra-articular fracture, three-part distal radius fracture with right-sided median nerve compression, acute carpal tunnel syndrome.  PROCEDURE:  Open reduction and internal fixation, displaced three-part intra-articular fracture of the right distal radius with release of the median nerve at the right wrist and right carpal tunnel.  SURGEON:  Dr. Mina MarbleWeingold.  ASSISTANT:  Jonni Sangerobert J. Dasnoit, P.A.     Artist PaisMatthew A. Mina MarbleWeingold, M.D.     MAW/MEDQ  D:  06/28/2016  T:  06/29/2016  Job:  [id number]

## 2017-08-08 ENCOUNTER — Encounter: Payer: Self-pay | Admitting: Gastroenterology

## 2017-09-26 ENCOUNTER — Encounter: Payer: Self-pay | Admitting: *Deleted

## 2017-09-29 ENCOUNTER — Ambulatory Visit (INDEPENDENT_AMBULATORY_CARE_PROVIDER_SITE_OTHER): Payer: Self-pay | Admitting: Gastroenterology

## 2017-09-29 ENCOUNTER — Encounter: Payer: Self-pay | Admitting: Gastroenterology

## 2017-09-29 VITALS — BP 104/68 | HR 72 | Ht 67.62 in | Wt 188.5 lb

## 2017-09-29 DIAGNOSIS — R195 Other fecal abnormalities: Secondary | ICD-10-CM

## 2017-09-29 NOTE — Patient Instructions (Addendum)
If you are age 45 or older, your body mass index should be between 23-30. Your Body mass index is 28.99 kg/m. If this is out of the aforementioned range listed, please consider follow up with your Primary Care Provider.  If you are age 45 or younger, your body mass index should be between 19-25. Your Body mass index is 28.99 kg/m. If this is out of the aformentioned range listed, please consider follow up with your Primary Care Provider.   You have been scheduled for a colonoscopy. Please follow written instructions given to you at your visit today.  Please pick up your prep supplies at the pharmacy within the next 1-3 days. If you use inhalers (even only as needed), please bring them with you on the day of your procedure. Your physician has requested that you go to www.startemmi.com and enter the access code given to you at your visit today. This web site gives a general overview about your procedure. However, you should still follow specific instructions given to you by our office regarding your preparation for the procedure.  You have been given a sample of Plenvu today to prep for your Colonoscopy.  ** 2 weeks prior to your procedure take 1 capful of Miralax daily**  Thank you.

## 2017-09-29 NOTE — Progress Notes (Signed)
Charles RinneWalter R Kotlyar Jr.    [id number]    04/03/1971  Primary Care Physician:Patient, No Pcp Per  Referring Physician: No referring provider defined for this encounter.  Chief complaint: Fecal Hemoccult positive  HPI: 46 year old African-American male here for evaluation with positive fecal heme occult.  He was seen for enlarged prostate, had stool testing done subsequently was Hemoccult positive.  He was sent home with stool cards and was informed by his physician that his fecal Hemoccult was positive.  He denies any melena or blood in stool.  He does notice occasional smear of blood after bowel movement and he wipes, thinks it is from hemorrhoids. No family history of colon cancer Denies any nausea, vomiting, abdominal pain, melena or bright red blood per rectum. No history of NSAID use.  Denies any GERD, reflux symptoms or dysphagia.    Outpatient Encounter Medications as of 09/29/2017  Medication Sig  . [DISCONTINUED] HYDROcodone-acetaminophen (NORCO/VICODIN) 5-325 MG tablet Take 1-2 tablets by mouth every 6 (six) hours as needed for severe pain.  . [DISCONTINUED] oxyCODONE-acetaminophen (ROXICET) 5-325 MG tablet Take 1 tablet by mouth every 4 (four) hours as needed for severe pain.   No facility-administered encounter medications on file as of 09/29/2017.     Allergies as of 09/29/2017 - Review Complete 09/29/2017  Allergen Reaction Noted  . Penicillins Hives 05/03/2016    Past Medical History:  Diagnosis Date  . Distal radius fracture, right   . Enlarged prostate     Past Surgical History:  Procedure Laterality Date  . CARPAL TUNNEL RELEASE Right 06/22/2016   Procedure: CARPAL TUNNEL RELEASE,right;  Surgeon: Dairl PonderMatthew Weingold, MD;  Location: [facility];  Service: Orthopedics;  Laterality: Right;  . HERNIA REPAIR     as baby  . OPEN REDUCTION INTERNAL FIXATION (ORIF) DISTAL RADIAL FRACTURE Right 06/22/2016   Procedure: OPEN REDUCTION INTERNAL  FIXATION (ORIF) DISTAL RADIAL FRACTURE,right;  Surgeon: Dairl PonderMatthew Weingold, MD;  Location: [facility];  Service: Orthopedics;  Laterality: Right;  Axillary block    Family History  Problem Relation Age of Onset  . Prostate cancer Maternal Grandfather   . Breast cancer Mother     Social History   Socioeconomic History  . Marital status: Single    Spouse name: Not on file  . Number of children: 2  . Years of education: Not on file  . Highest education level: Not on file  Social Needs  . Financial resource strain: Not on file  . Food insecurity - worry: Not on file  . Food insecurity - inability: Not on file  . Transportation needs - medical: Not on file  . Transportation needs - non-medical: Not on file  Occupational History  . 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 Smoking status: Current Every Day Smoker    Packs/day: 0.50    Types: Cigarettes  . Smokeless tobacco: Never Used  Substance and Sexual Activity  . Alcohol use: No  . Drug use: No  . Sexual activity: Not on file  Other Topics Concern  . Not on file  Social History Narrative  . Not on file      Review of systems: Review of Systems  Constitutional: Negative for fever and chills.  HENT: Negative.   Eyes: Negative for blurred vision.  Respiratory: Negative for cough, shortness of breath and wheezing.   Cardiovascular: Negative for chest pain and palpitations.  Gastrointestinal: as per HPI Genitourinary: Negative for  dysuria, urgency, frequency and hematuria.  Musculoskeletal: Negative for myalgias, back pain and joint pain.  Skin: Negative for itching and rash.  Neurological: Negative for dizziness, tremors, focal weakness, seizures and loss of consciousness.  Endo/Heme/Allergies: Positive for seasonal allergies.  Psychiatric/Behavioral: Negative for depression, suicidal ideas and hallucinations.  All other systems reviewed and are negative.   Physical Exam: Vitals:   09/29/17 1414    BP: 104/68  Pulse: 72   Body mass index is 28.99 kg/m. Gen:      No acute distress HEENT:  EOMI, sclera anicteric Neck:     No masses; no thyromegaly Lungs:    Clear to auscultation bilaterally; normal respiratory effort CV:         Regular rate and rhythm; no murmurs Abd:      + bowel sounds; soft, non-tender; no palpable masses, no distension Ext:    No edema; adequate peripheral perfusion Skin:      Warm and dry; no rash Neuro: alert and oriented x 3 Psych: normal mood and affect  Data Reviewed:  Reviewed labs, radiology imaging, old records and pertinent past GI work up   Assessment and Plan/Recommendations:  46 year old African-American male here for evaluation with positive fecal Hemoccult He has no specific GI issues We will proceed with colonoscopy to further evaluate The risks and benefits as well as alternatives of endoscopic procedure(s) have been discussed and reviewed. All questions answered. The patient agrees to proceed. Discussed in detail with patient potential differential, if colonoscopy unremarkable may have to consider EGD and +/- small bowel video capsule   K. Scherry RanVeena Akeelah Seppala , MD [phone] Mon-Fri 8a-5p [phone] after 5p, weekends, holidays  CC: No ref. provider found

## 2017-11-24 ENCOUNTER — Encounter: Payer: Self-pay | Admitting: Gastroenterology

## 2017-11-24 ENCOUNTER — Other Ambulatory Visit: Payer: Self-pay

## 2017-11-24 ENCOUNTER — Ambulatory Visit (AMBULATORY_SURGERY_CENTER): Payer: Self-pay | Admitting: Gastroenterology

## 2017-11-24 VITALS — BP 114/72 | HR 64 | Temp 99.1°F | Resp 17 | Ht 67.5 in | Wt 188.0 lb

## 2017-11-24 DIAGNOSIS — K6289 Other specified diseases of anus and rectum: Secondary | ICD-10-CM

## 2017-11-24 DIAGNOSIS — R195 Other fecal abnormalities: Secondary | ICD-10-CM

## 2017-11-24 DIAGNOSIS — K921 Melena: Secondary | ICD-10-CM

## 2017-11-24 MED ORDER — SODIUM CHLORIDE 0.9 % IV SOLN: 500.0000 mL | Freq: Once | INTRAVENOUS | Status: DC

## 2017-11-24 NOTE — Progress Notes (Signed)
Called to room to assist during endoscopic procedure.  Patient ID and intended procedure confirmed with present staff. Received instructions for my participation in the procedure from the performing physician.  

## 2017-11-24 NOTE — Op Note (Signed)
[facility] Patient Name: Charles Delgado Procedure Date: 11/24/2017 1:40 PM MRN: [id number] Endoscopist: Napoleon Form , MD Age: 47 Referring MD:  Date of Birth: August 04, 1971 Gender: Male Account #: [id number] Procedure:                Colonoscopy Indications:              Evaluation of unexplained GI bleeding Medicines:                Monitored Anesthesia Care Procedure:                Pre-Anesthesia Assessment:                           - Prior to the procedure, a History and Physical                            was performed, and patient medications and                            allergies were reviewed. The patient's tolerance of                            previous anesthesia was also reviewed. The risks                            and benefits of the procedure and the sedation                            options and risks were discussed with the patient.                            All questions were answered, and informed consent                            was obtained. Prior Anticoagulants: The patient has                            taken no previous anticoagulant or antiplatelet                            agents. ASA Grade Assessment: II - A patient with                            mild systemic disease. After reviewing the risks                            and benefits, the patient was deemed in                            satisfactory condition to undergo the procedure.                           After obtaining informed consent, the colonoscope  was passed under direct vision. Throughout the                            procedure, the patient's blood pressure, pulse, and                            oxygen saturations were monitored continuously. The                            Colonoscope was introduced through the anus and                            advanced to the the terminal ileum, with                            identification of the  appendiceal orifice and IC                            valve. The colonoscopy was performed without                            difficulty. The patient tolerated the procedure                            well. The quality of the bowel preparation was                            excellent. The terminal ileum, ileocecal valve,                            appendiceal orifice, and rectum were photographed. Scope In: 1:44:11 PM Scope Out: 2:07:06 PM Scope Withdrawal Time: 0 hours 14 minutes 50 seconds  Total Procedure Duration: 0 hours 22 minutes 55 seconds  Findings:                 A patchy area of mucosa in the terminal ileum was                            mildly erythematous. Biopsies were taken with a                            cold forceps for histology.                           Multiple small and large-mouthed diverticula were                            found in the sigmoid colon and descending colon.                            Peri-diverticular erythema was seen. Biopsies were                            taken with a cold forceps for histology.  A few small-mouthed diverticula were found in the                            transverse colon and ascending colon.                           A patchy area of mildly erythematous mucosa was                            found in the rectum. Biopsies were taken with a                            cold forceps for histology.                           The exam was otherwise without abnormality. Complications:            No immediate complications. Estimated Blood Loss:     Estimated blood loss was minimal. Impression:               - Erythematous mucosa in the terminal ileum.                            Biopsied.                           - Severe diverticulosis in the sigmoid colon and in                            the descending colon. Peri-diverticular erythema                            was seen. Biopsied.                            - Mild diverticulosis in the transverse colon and                            in the ascending colon.                           - Erythematous mucosa in the rectum. Biopsied.                           - The examination was otherwise normal. Recommendation:           - Patient has a contact number available for                            emergencies. The signs and symptoms of potential                            delayed complications were discussed with the                            patient. Return to normal activities tomorrow.  Written discharge instructions were provided to the                            patient.                           - Resume previous diet.                           - Continue present medications.                           - Await pathology results.                           - Repeat colonoscopy date to be determined after                            pending pathology results are reviewed for                            surveillance based on pathology results.                           - Schedule for EGD Napoleon FormKavitha V. Rashiya Lofland, MD 11/24/2017 2:15:09 PM This report has been signed electronically.

## 2017-11-24 NOTE — Patient Instructions (Signed)
YOU HAD AN ENDOSCOPIC PROCEDURE TODAY AT THE [facility]:   Refer to the procedure report that was given to you for any specific questions about what was found during the examination.  If the procedure report does not answer your questions, please call your gastroenterologist to clarify.  If you requested that your care partner not be given the details of your procedure findings, then the procedure report has been included in a sealed envelope for you to review at your convenience later.  YOU SHOULD EXPECT: Some feelings of bloating in the abdomen. Passage of more gas than usual.  Walking can help get rid of the air that was put into your GI tract during the procedure and reduce the bloating. If you had a lower endoscopy (such as a colonoscopy or flexible sigmoidoscopy) you may notice spotting of blood in your stool or on the toilet paper. If you underwent a bowel prep for your procedure, you may not have a normal bowel movement for a few days.  Please Note:  You might notice some irritation and congestion in your nose or some drainage.  This is from the oxygen used during your procedure.  There is no need for concern and it should clear up in a day or so.  SYMPTOMS TO REPORT IMMEDIATELY:   Following lower endoscopy (colonoscopy or flexible sigmoidoscopy):  Excessive amounts of blood in the stool  Significant tenderness or worsening of abdominal pains  Swelling of the abdomen that is new, acute  Fever of 100F or higher  For urgent or emergent issues, a gastroenterologist can be reached at any hour by calling [phone].   DIET:  We do recommend a small meal at first, but then you may proceed to your regular diet.  Drink plenty of fluids but you should avoid alcoholic beverages for 24 hours.  ACTIVITY:  You should plan to take it easy for the rest of today and you should NOT DRIVE or use heavy machinery until tomorrow (because of the sedation medicines used during the test).     FOLLOW UP: Our staff will call the number listed on your records the next business day following your procedure to check on you and address any questions or concerns that you may have regarding the information given to you following your procedure. If we do not reach you, we will leave a message.  However, if you are feeling well and you are not experiencing any problems, there is no need to return our call.  We will assume that you have returned to your regular daily activities without incident.  If any biopsies were taken you will be contacted by phone or by letter within the next 1-3 weeks.  Please call us at [phone] if you have not heard about the biopsies in 3 weeks.    Await for biopsy results EGD schedule January 19, 2018 1000 Pre--visit March 22. 2019 Diverticulosis (handout given)  SIGNATURES/CONFIDENTIALITY: You and/or your care partner have signed paperwork which will be entered into your electronic medical record.  These signatures attest to the fact that that the information above on your After Visit Summary has been reviewed and is understood.  Full responsibility of the confidentiality of this discharge information lies with you and/or your care-partner.

## 2017-11-24 NOTE — Progress Notes (Signed)
Pt's states no medical or surgical changes since previsit or office visit. 

## 2017-11-24 NOTE — Progress Notes (Signed)
Report to PACU, RN, vss, BBS= Clear.  

## 2017-11-27 ENCOUNTER — Telehealth: Payer: Self-pay | Admitting: *Deleted

## 2017-11-27 NOTE — Telephone Encounter (Signed)
No answer. Number identifier. Message left to call if questions or concerns. 

## 2017-11-27 NOTE — Telephone Encounter (Signed)
No answer for post procedure call back. Will attempt to call back later this afternoon. SM 

## 2017-11-30 ENCOUNTER — Telehealth: Payer: Self-pay

## 2017-11-30 NOTE — Telephone Encounter (Signed)
Discussed the results. Made a follow up appointment for the patient in April. He states he is unable to come in for the EGD (which he also states he was unaware of).  He is in a new job and cannot ask for the time off.

## 2017-11-30 NOTE — Telephone Encounter (Signed)
-----   Message from Napoleon FormKavitha Nandigam V, MD sent at 11/30/2017  9:41 AM EST ----- He has focally active non specific ileitis. Please advise patient to avoid NSAID's . Follow up in office visit. Recall colonoscopy in 10 years. Thanks

## 2017-11-30 NOTE — Telephone Encounter (Signed)
Ok will discuss again at follow up visit.

## 2017-12-15 ENCOUNTER — Telehealth: Payer: Self-pay | Admitting: Gastroenterology

## 2017-12-15 NOTE — Telephone Encounter (Signed)
Spoke with the patient. He reports a rash that has developed at the site of his IV which was place for his procedure on 11/24/17. He states the rash started a few days after the IV and has progressed. Describes it as raised red bumps and itching. Denies open sores or pustules. There is no swelling or redness of the skin. Per Dr Lavon PaganiniNandigam this is probably not related to the IV. Encouraged to have this looked at by his PCP if he is still concerned.

## 2018-01-19 ENCOUNTER — Encounter: Payer: Self-pay | Admitting: Gastroenterology

## 2018-02-02 ENCOUNTER — Ambulatory Visit: Payer: Self-pay | Admitting: Gastroenterology

## 2020-05-21 ENCOUNTER — Ambulatory Visit: Payer: Self-pay | Admitting: Physician Assistant

## 2020-07-01 ENCOUNTER — Ambulatory Visit: Payer: Self-pay | Admitting: Physician Assistant

## 2020-12-17 ENCOUNTER — Other Ambulatory Visit (HOSPITAL_COMMUNITY): Payer: Self-pay | Admitting: Physical Medicine and Rehabilitation

## 2020-12-17 DIAGNOSIS — M545 Low back pain, unspecified: Secondary | ICD-10-CM

## 2020-12-19 ENCOUNTER — Ambulatory Visit (HOSPITAL_COMMUNITY)
Admission: RE | Admit: 2020-12-19 | Discharge: 2020-12-19 | Disposition: A | Discharge status: Home / Self Care | Payer: No Typology Code available for payment source | Source: Ambulatory Visit | Attending: Internal Medicine | Admitting: Internal Medicine

## 2020-12-19 DIAGNOSIS — M545 Low back pain, unspecified: Secondary | ICD-10-CM | POA: Insufficient documentation

## 2021-01-12 ENCOUNTER — Ambulatory Visit: Payer: Self-pay

## 2021-01-12 ENCOUNTER — Other Ambulatory Visit: Payer: Self-pay

## 2021-01-12 ENCOUNTER — Encounter: Payer: Self-pay | Admitting: Orthopaedic Surgery

## 2021-01-12 ENCOUNTER — Ambulatory Visit (INDEPENDENT_AMBULATORY_CARE_PROVIDER_SITE_OTHER): Payer: No Typology Code available for payment source | Admitting: Orthopaedic Surgery

## 2021-01-12 VITALS — BP 163/136 | HR 103

## 2021-01-12 DIAGNOSIS — M5441 Lumbago with sciatica, right side: Secondary | ICD-10-CM

## 2021-01-12 DIAGNOSIS — M5126 Other intervertebral disc displacement, lumbar region: Secondary | ICD-10-CM

## 2021-01-12 MED ORDER — HYDROCODONE-ACETAMINOPHEN 5-325 MG PO TABS: 1.0000 | ORAL_TABLET | Freq: Four times a day (QID) | ORAL | 0 refills | Status: DC | PRN

## 2021-01-12 NOTE — Progress Notes (Signed)
Office Visit Note   Patient: Charles Delgado.           Date of Birth: May 06, 1971           MRN: [id number] Visit Date: 01/12/2021              Requested by: [facility] [address] [place],  [place] [address] PCP: [facility]   Assessment & Plan: Visit Diagnoses:  1. Right-sided low back pain with right-sided sciatica, unspecified chronicity   2. Lumbar herniated disc     Plan: MRI scan is reviewed.  Patient has a large free fragment and has radiculopathy with the right quad weakness giving way.  Patient spine with a cane for fall prevention.  Fragment is large with significant compression.  Would recommend microdiscectomy with overnight stay.  Work slip given no work x7 weeks.  All his lumbar disc are tall well hydrated and plump.  With his large cell free fragment he should do well with a simple microdiscectomy.  Procedure discussed risk surgery discussed overnight stay discussed.  We reviewed the MRI scan today as well as the report and all pertinent images.  Hydrocodone prescribed for acute pain upright avoid taking it.  Follow-Up Instructions: No follow-ups on file.   Orders:  Orders Placed This Encounter  Procedures  . XR Lumbar Spine 2-3 Views   Meds ordered this encounter  Medications  . HYDROcodone-acetaminophen (NORCO/VICODIN) 5-325 MG tablet    Sig: Take 1 tablet by mouth every 6 (six) hours as needed for moderate pain.    Dispense:  30 tablet    Refill:  0      Procedures: No procedures performed   Clinical Data: No additional findings.   Subjective: Chief Complaint  Patient presents with  . Lower Back - Pain    HPI 50 year old male seen with acute on-the-job injury when he was bending over picking up a box at several he felt sharp sudden back pain and left thigh pain.  He has had numbness that radiates down to his knee on the right side with quad weakness.  He was seen at [facility] urgent care treated  with prednisone Dosepak which did not help.  His only comfortable position is lying down on his side.  He has had problems attempting stairs with weakness in his right quadriceps.  No past history of back problems.  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  He denies fever chills no bowel bladder symptoms.  Date of injury was 12/07/2020.  Patient's been on Neurontin states has not helped prednisone Dosepak did not help.  Patient relates has pain with bending or trying to squat is severe.  He does get fairly good relief with supine position or lateral position on the left.  Review of Systems patient is a former smoker.  Positive for right leg numbness weakness.  Negative GI GU.  Negative cardiac history other than current hypertension in acute pain.  All the systems are noncontributory.   Objective: Vital Signs: BP (!) 163/136   Pulse (!) 103   Physical Exam Constitutional:      Appearance: He is well-developed.  HENT:     Head: Normocephalic and atraumatic.  Eyes:     Pupils: Pupils are equal, round, and reactive to light.  Neck:     Thyroid: No thyromegaly.     Trachea:  No tracheal deviation.  Cardiovascular:     Rate and Rhythm: Normal rate.  Pulmonary:     Effort: Pulmonary effort is normal.     Breath sounds: No wheezing.  Abdominal:     General: Bowel sounds are normal.     Palpations: Abdomen is soft.  Skin:    General: Skin is warm and dry.     Capillary Refill: Capillary refill takes less than 2 seconds.  Neurological:     Mental Status: He is alert and oriented to person, place, and time.  Psychiatric:        Behavior: Behavior normal.        Thought Content: Thought content normal.        Judgment: Judgment normal.     Ortho Exam patient slow getting from sitting to standing.  He has decreased sensation anterior right thigh.  There is some quad and abductor weakness on the right and I  can overcome his quad with 2 fingers at the ankle.  Left quad is strong.  Anterior tib EHL is strong right and left distal pulses are 2+.  No gastrocsoleus weakness. Specialty Comments:  No specialty comments available.  Imaging: CLINICAL DATA:  Low back pain radiating into the right buttock, groin and leg after lifting injury 3 weeks ago. No previous relevant surgery.  EXAM: MRI LUMBAR SPINE WITHOUT CONTRAST  TECHNIQUE: Multiplanar, multisequence MR imaging of the lumbar spine was performed. No intravenous contrast was administered.  COMPARISON:  Abdominopelvic CT 05/03/2016.  FINDINGS: Segmentation: Conventional anatomy assumed, with the last open disc space designated L5-S1.Concordant with previous imaging.  Alignment: Straightening without focal angulation or significant listhesis.  Vertebrae: No worrisome osseous lesion, acute fracture or pars defect. The lumbar pedicles are somewhat short on a congenital basis. The visualized sacroiliac joints appear unremarkable.  Conus medullaris: Extends to the L2 level and appears normal.  Paraspinal and other soft tissues: No significant paraspinal findings.  Disc levels:  No significant disc space findings at T12-L1, L1-2 or L2-3.  L3-4: There is a new anterior epidural lesion within the right L3 lateral recess which measures 1.3 x 0.8 x 1.0 cm and is most likely a superiorly migrated free disc fragment. There is mass effect on the thecal sac and probable right L3 and L4 nerve root encroachment. Mild facet and ligamentous hypertrophy.  L4-5: Disc height and hydration are maintained. Mild bilateral facet hypertrophy. No spinal stenosis or nerve root encroachment.  L5-S1: Mild disc bulging with a shallow disc protrusion in the right subarticular zone. This contacts the right S1 nerve root. No significant foraminal narrowing or L5 nerve root encroachment.  IMPRESSION: 1. Probable superiorly migrated free disc  fragment in the right L3 lateral recess with resulting mass effect on the thecal sac and probable right L3 and L4 nerve root encroachment. 2. Shallow disc protrusion in the right subarticular zone at L5-S1 may contribute to right S1 nerve root encroachment. 3. No other significant disc space findings.   Electronically Signed   By: Carey Bullocks M.D.   On: 12/19/2020 14:47   PMFS History: Patient Active Problem List   Diagnosis Date Noted  . Lumbar herniated disc 01/13/2021   Past Medical History:  Diagnosis Date  . Distal radius fracture, right   . Enlarged prostate     Family History  Problem Relation Age of Onset  . Prostate cancer Maternal Grandfather   . Breast cancer Mother     Past Surgical History:  Procedure Laterality Date  .  CARPAL TUNNEL RELEASE Right 06/22/2016   Procedure: CARPAL TUNNEL RELEASE,right;  Surgeon: Dairl [facility], MD;  Location: [facility];  Service: Orthopedics;  Laterality: Right;  . HERNIA REPAIR     as baby  . OPEN REDUCTION INTERNAL FIXATION (ORIF) DISTAL RADIAL FRACTURE Right 06/22/2016   Procedure: OPEN REDUCTION INTERNAL FIXATION (ORIF) DISTAL RADIAL FRACTURE,right;  Surgeon: Dairl Ponder, MD;  Location: [facility];  Service: Orthopedics;  Laterality: Right;  Axillary block   Social History   Occupational History  . Occupation: truck Hospital doctor  Tobacco Use  . Smoking status: Current Every Day Smoker    Packs/day: 0.50    Types: Cigarettes  . Smokeless tobacco: Never Used  Substance and Sexual Activity  . Alcohol use: No  . Drug use: No  . Sexual activity: Not on file

## 2021-01-13 DIAGNOSIS — M5126 Other intervertebral disc displacement, lumbar region: Secondary | ICD-10-CM | POA: Insufficient documentation

## 2021-02-11 ENCOUNTER — Other Ambulatory Visit (HOSPITAL_COMMUNITY): Payer: BC Managed Care – PPO

## 2021-02-11 ENCOUNTER — Ambulatory Visit: Payer: BC Managed Care – PPO | Admitting: Surgery

## 2021-02-18 ENCOUNTER — Ambulatory Visit (INDEPENDENT_AMBULATORY_CARE_PROVIDER_SITE_OTHER): Payer: No Typology Code available for payment source | Admitting: Surgery

## 2021-02-18 ENCOUNTER — Other Ambulatory Visit: Payer: Self-pay

## 2021-02-18 ENCOUNTER — Encounter: Payer: Self-pay | Admitting: Surgery

## 2021-02-18 ENCOUNTER — Other Ambulatory Visit (HOSPITAL_COMMUNITY)
Admission: RE | Admit: 2021-02-18 | Discharge: 2021-02-18 | Disposition: A | Discharge status: Home / Self Care | Payer: BC Managed Care – PPO | Source: Ambulatory Visit | Attending: Orthopaedic Surgery | Admitting: Orthopaedic Surgery

## 2021-02-18 VITALS — BP 134/93 | HR 81 | Ht 67.5 in | Wt 188.0 lb

## 2021-02-18 DIAGNOSIS — Z01812 Encounter for preprocedural laboratory examination: Secondary | ICD-10-CM | POA: Insufficient documentation

## 2021-02-18 DIAGNOSIS — Z20822 Contact with and (suspected) exposure to covid-19: Secondary | ICD-10-CM | POA: Insufficient documentation

## 2021-02-18 DIAGNOSIS — M5126 Other intervertebral disc displacement, lumbar region: Secondary | ICD-10-CM

## 2021-02-18 LAB — SARS CORONAVIRUS 2 (TAT 6-24 HRS): SARS Coronavirus 2: NEGATIVE

## 2021-02-18 NOTE — Progress Notes (Signed)
50 year old black male history of right L3-4 HNP/free fragment, low back pain and right lower extremity radiculopathy with weakness comes in for preop evaluation.  States that symptoms unchanged from previous visit.  He is wanting to proceed with right L3-4 microdiscectomy as scheduled.  Today history physical performed.  Review of systems negative.  Surgical procedure discussed.  All questions answered.

## 2021-02-19 ENCOUNTER — Other Ambulatory Visit: Payer: Self-pay

## 2021-02-19 ENCOUNTER — Encounter (HOSPITAL_COMMUNITY): Payer: Self-pay | Admitting: Orthopaedic Surgery

## 2021-02-19 NOTE — Progress Notes (Signed)
Mr. Sippel denies chest pain or shortness of breath. Patient was tested for Covid and has been in quarantine since that time.

## 2021-02-22 ENCOUNTER — Other Ambulatory Visit: Payer: Self-pay

## 2021-02-22 ENCOUNTER — Ambulatory Visit (HOSPITAL_COMMUNITY): Payer: No Typology Code available for payment source

## 2021-02-22 ENCOUNTER — Ambulatory Visit (HOSPITAL_COMMUNITY): Payer: No Typology Code available for payment source | Admitting: Certified Registered Nurse Anesthetist

## 2021-02-22 ENCOUNTER — Encounter (HOSPITAL_COMMUNITY)
Admission: RE | Disposition: A | Discharge status: Home / Self Care | Payer: Self-pay | Source: Home / Self Care | Attending: Orthopaedic Surgery

## 2021-02-22 ENCOUNTER — Observation Stay (HOSPITAL_COMMUNITY)
Admission: RE | Admit: 2021-02-22 | Discharge: 2021-02-24 | Disposition: A | Discharge status: Home / Self Care | Payer: No Typology Code available for payment source | Attending: Orthopaedic Surgery | Admitting: Orthopaedic Surgery

## 2021-02-22 ENCOUNTER — Encounter (HOSPITAL_COMMUNITY): Payer: Self-pay | Admitting: Orthopaedic Surgery

## 2021-02-22 DIAGNOSIS — Z87891 Personal history of nicotine dependence: Secondary | ICD-10-CM | POA: Insufficient documentation

## 2021-02-22 DIAGNOSIS — M5126 Other intervertebral disc displacement, lumbar region: Principal | ICD-10-CM | POA: Insufficient documentation

## 2021-02-22 DIAGNOSIS — M545 Low back pain, unspecified: Secondary | ICD-10-CM | POA: Diagnosis present

## 2021-02-22 DIAGNOSIS — Z419 Encounter for procedure for purposes other than remedying health state, unspecified: Secondary | ICD-10-CM

## 2021-02-22 DIAGNOSIS — Z9889 Other specified postprocedural states: Secondary | ICD-10-CM | POA: Insufficient documentation

## 2021-02-22 HISTORY — DX: Other specified health status: Z78.9

## 2021-02-22 HISTORY — PX: LUMBAR LAMINECTOMY: SHX95

## 2021-02-22 LAB — CBC
HCT: 45.3 % (ref 39.0–52.0)
Hemoglobin: 14.9 g/dL (ref 13.0–17.0)
MCH: 30.6 pg (ref 26.0–34.0)
MCHC: 32.9 g/dL (ref 30.0–36.0)
MCV: 93 fL (ref 80.0–100.0)
Platelets: 274 10*3/uL (ref 150–400)
RBC: 4.87 MIL/uL (ref 4.22–5.81)
RDW: 12.7 % (ref 11.5–15.5)
WBC: 5 10*3/uL (ref 4.0–10.5)
nRBC: 0 % (ref 0.0–0.2)

## 2021-02-22 LAB — COMPREHENSIVE METABOLIC PANEL
ALT: 19 U/L (ref 0–44)
AST: 19 U/L (ref 15–41)
Albumin: 4.2 g/dL (ref 3.5–5.0)
Alkaline Phosphatase: 59 U/L (ref 38–126)
Anion gap: 9 (ref 5–15)
BUN: 13 mg/dL (ref 6–20)
CO2: 25 mmol/L (ref 22–32)
Calcium: 9.7 mg/dL (ref 8.9–10.3)
Chloride: 108 mmol/L (ref 98–111)
Creatinine, Ser: 1.24 mg/dL (ref 0.61–1.24)
GFR, Estimated: >60 mL/min (ref >60)
Glucose, Bld: 99 mg/dL (ref 70–99)
Potassium: 4.4 mmol/L (ref 3.5–5.1)
Sodium: 142 mmol/L (ref 135–145)
Total Bilirubin: 0.7 mg/dL (ref 0.3–1.2)
Total Protein: 7.1 g/dL (ref 6.5–8.1)

## 2021-02-22 LAB — SURGICAL PCR SCREEN
MRSA, PCR: NEGATIVE
Staphylococcus aureus: POSITIVE — AB

## 2021-02-22 SURGERY — MICRODISCECTOMY LUMBAR LAMINECTOMY
Anesthesia: General

## 2021-02-22 MED ORDER — PROPOFOL 10 MG/ML IV BOLUS
INTRAVENOUS | Status: DC | PRN
  Administered 2021-02-22: 200 mg via INTRAVENOUS

## 2021-02-22 MED ORDER — FENTANYL CITRATE (PF) 250 MCG/5ML IJ SOLN
INTRAMUSCULAR | Status: AC
  Filled 2021-02-22: qty 5

## 2021-02-22 MED ORDER — ONDANSETRON HCL 4 MG/2ML IJ SOLN: 4.0000 mg | Freq: Four times a day (QID) | INTRAMUSCULAR | Status: DC | PRN

## 2021-02-22 MED ORDER — MIDAZOLAM HCL 2 MG/2ML IJ SOLN
INTRAMUSCULAR | Status: DC | PRN
  Administered 2021-02-22: 2 mg via INTRAVENOUS

## 2021-02-22 MED ORDER — OXYCODONE HCL 5 MG PO TABS
5.0000 mg | ORAL_TABLET | Freq: Once | ORAL | Status: AC | PRN
  Administered 2021-02-22: 5 mg via ORAL

## 2021-02-22 MED ORDER — THROMBIN (RECOMBINANT) 5000 UNITS EX SOLR
CUTANEOUS | Status: AC
  Filled 2021-02-22: qty 5000

## 2021-02-22 MED ORDER — ONDANSETRON HCL 4 MG/2ML IJ SOLN: 4.0000 mg | Freq: Once | INTRAMUSCULAR | Status: DC | PRN

## 2021-02-22 MED ORDER — VANCOMYCIN HCL IN DEXTROSE 1-5 GM/200ML-% IV SOLN
1000.0000 mg | INTRAVENOUS | Status: AC
  Administered 2021-02-22: 1000 mg via INTRAVENOUS
  Filled 2021-02-22: qty 200

## 2021-02-22 MED ORDER — FENTANYL CITRATE (PF) 100 MCG/2ML IJ SOLN
25.0000 ug | INTRAMUSCULAR | Status: DC | PRN
  Administered 2021-02-22 (×2): 50 ug via INTRAVENOUS

## 2021-02-22 MED ORDER — CHLORHEXIDINE GLUCONATE 0.12 % MT SOLN
OROMUCOSAL | Status: AC
  Administered 2021-02-22: 15 mL via OROMUCOSAL
  Filled 2021-02-22: qty 15

## 2021-02-22 MED ORDER — BUPIVACAINE HCL (PF) 0.25 % IJ SOLN
INTRAMUSCULAR | Status: DC | PRN
  Administered 2021-02-22: 30 mL

## 2021-02-22 MED ORDER — OXYCODONE HCL 5 MG PO TABS
5.0000 mg | ORAL_TABLET | ORAL | Status: DC | PRN
  Administered 2021-02-22 – 2021-02-23 (×4): 10 mg via ORAL
  Administered 2021-02-23: 5 mg via ORAL
  Administered 2021-02-23 (×2): 10 mg via ORAL
  Administered 2021-02-24: 5 mg via ORAL
  Filled 2021-02-22 (×6): qty 2
  Filled 2021-02-22: qty 1
  Filled 2021-02-22: qty 2

## 2021-02-22 MED ORDER — METHOCARBAMOL 500 MG PO TABS
500.0000 mg | ORAL_TABLET | Freq: Four times a day (QID) | ORAL | Status: DC | PRN
  Administered 2021-02-22 – 2021-02-24 (×5): 500 mg via ORAL
  Filled 2021-02-22 (×4): qty 1

## 2021-02-22 MED ORDER — METHOCARBAMOL 500 MG PO TABS
ORAL_TABLET | ORAL | Status: AC
  Filled 2021-02-22: qty 1

## 2021-02-22 MED ORDER — BUPIVACAINE HCL (PF) 0.25 % IJ SOLN
INTRAMUSCULAR | Status: AC
  Filled 2021-02-22: qty 30

## 2021-02-22 MED ORDER — MIDAZOLAM HCL 2 MG/2ML IJ SOLN
INTRAMUSCULAR | Status: AC
  Filled 2021-02-22: qty 2

## 2021-02-22 MED ORDER — LIDOCAINE 2% (20 MG/ML) 5 ML SYRINGE
INTRAMUSCULAR | Status: DC | PRN
  Administered 2021-02-22: 60 mg via INTRAVENOUS

## 2021-02-22 MED ORDER — ROCURONIUM BROMIDE 10 MG/ML (PF) SYRINGE
PREFILLED_SYRINGE | INTRAVENOUS | Status: AC
  Filled 2021-02-22: qty 10

## 2021-02-22 MED ORDER — DEXAMETHASONE SODIUM PHOSPHATE 10 MG/ML IJ SOLN
INTRAMUSCULAR | Status: AC
  Filled 2021-02-22: qty 1

## 2021-02-22 MED ORDER — SODIUM CHLORIDE 0.9 % IV SOLN: INTRAVENOUS | Status: DC

## 2021-02-22 MED ORDER — POLYETHYLENE GLYCOL 3350 17 G PO PACK: 17.0000 g | PACK | Freq: Every day | ORAL | Status: DC | PRN

## 2021-02-22 MED ORDER — PROPOFOL 10 MG/ML IV BOLUS
INTRAVENOUS | Status: AC
  Filled 2021-02-22: qty 20

## 2021-02-22 MED ORDER — FENTANYL CITRATE (PF) 250 MCG/5ML IJ SOLN
INTRAMUSCULAR | Status: DC | PRN
  Administered 2021-02-22 (×2): 50 ug via INTRAVENOUS
  Administered 2021-02-22: 150 ug via INTRAVENOUS

## 2021-02-22 MED ORDER — SUGAMMADEX SODIUM 200 MG/2ML IV SOLN
INTRAVENOUS | Status: DC | PRN
  Administered 2021-02-22: 200 mg via INTRAVENOUS
  Administered 2021-02-22: 100 mg via INTRAVENOUS

## 2021-02-22 MED ORDER — MUPIROCIN 2 % EX OINT
1.0000 "application " | TOPICAL_OINTMENT | Freq: Two times a day (BID) | CUTANEOUS | Status: DC
  Administered 2021-02-22 – 2021-02-24 (×4): 1 via NASAL
  Filled 2021-02-22 (×2): qty 22

## 2021-02-22 MED ORDER — OXYCODONE HCL 5 MG PO TABS
ORAL_TABLET | ORAL | Status: AC
  Filled 2021-02-22: qty 1

## 2021-02-22 MED ORDER — CHLORHEXIDINE GLUCONATE 0.12 % MT SOLN: 15.0000 mL | Freq: Once | OROMUCOSAL | Status: AC

## 2021-02-22 MED ORDER — DEXAMETHASONE SODIUM PHOSPHATE 10 MG/ML IJ SOLN
INTRAMUSCULAR | Status: DC | PRN
  Administered 2021-02-22: 5 mg via INTRAVENOUS

## 2021-02-22 MED ORDER — SODIUM CHLORIDE 0.9% FLUSH
3.0000 mL | Freq: Two times a day (BID) | INTRAVENOUS | Status: DC
  Administered 2021-02-22 – 2021-02-23 (×3): 3 mL via INTRAVENOUS

## 2021-02-22 MED ORDER — MENTHOL 3 MG MT LOZG: 1.0000 | LOZENGE | OROMUCOSAL | Status: DC | PRN

## 2021-02-22 MED ORDER — OXYCODONE HCL 5 MG/5ML PO SOLN: 5.0000 mg | Freq: Once | ORAL | Status: AC | PRN

## 2021-02-22 MED ORDER — ONDANSETRON HCL 4 MG PO TABS: 4.0000 mg | ORAL_TABLET | Freq: Four times a day (QID) | ORAL | Status: DC | PRN

## 2021-02-22 MED ORDER — 0.9 % SODIUM CHLORIDE (POUR BTL) OPTIME
TOPICAL | Status: DC | PRN
  Administered 2021-02-22: 1000 mL

## 2021-02-22 MED ORDER — PHENOL 1.4 % MT LIQD: 1.0000 | OROMUCOSAL | Status: DC | PRN

## 2021-02-22 MED ORDER — HYDROMORPHONE HCL 1 MG/ML IJ SOLN
0.5000 mg | INTRAMUSCULAR | Status: DC | PRN
  Administered 2021-02-22 – 2021-02-23 (×3): 0.5 mg via INTRAVENOUS
  Filled 2021-02-22 (×3): qty 0.5

## 2021-02-22 MED ORDER — ROCURONIUM BROMIDE 10 MG/ML (PF) SYRINGE
PREFILLED_SYRINGE | INTRAVENOUS | Status: DC | PRN
  Administered 2021-02-22: 60 mg via INTRAVENOUS
  Administered 2021-02-22: 20 mg via INTRAVENOUS
  Administered 2021-02-22: 10 mg via INTRAVENOUS

## 2021-02-22 MED ORDER — LACTATED RINGERS IV SOLN: INTRAVENOUS | Status: DC

## 2021-02-22 MED ORDER — ACETAMINOPHEN 325 MG PO TABS
650.0000 mg | ORAL_TABLET | ORAL | Status: DC | PRN
  Administered 2021-02-24: 650 mg via ORAL
  Filled 2021-02-22: qty 2

## 2021-02-22 MED ORDER — LIDOCAINE 2% (20 MG/ML) 5 ML SYRINGE
INTRAMUSCULAR | Status: AC
  Filled 2021-02-22: qty 10

## 2021-02-22 MED ORDER — FENTANYL CITRATE (PF) 100 MCG/2ML IJ SOLN
INTRAMUSCULAR | Status: AC
  Filled 2021-02-22: qty 2

## 2021-02-22 MED ORDER — HEMOSTATIC AGENTS (NO CHARGE) OPTIME
TOPICAL | Status: DC | PRN
  Administered 2021-02-22: 1 via TOPICAL

## 2021-02-22 MED ORDER — ONDANSETRON HCL 4 MG/2ML IJ SOLN
INTRAMUSCULAR | Status: AC
  Filled 2021-02-22: qty 4

## 2021-02-22 MED ORDER — ORAL CARE MOUTH RINSE: 15.0000 mL | Freq: Once | OROMUCOSAL | Status: AC

## 2021-02-22 MED ORDER — METHOCARBAMOL 1000 MG/10ML IJ SOLN
500.0000 mg | Freq: Four times a day (QID) | INTRAMUSCULAR | Status: DC | PRN
  Filled 2021-02-22: qty 5

## 2021-02-22 MED ORDER — DEXMEDETOMIDINE (PRECEDEX) IN NS 20 MCG/5ML (4 MCG/ML) IV SYRINGE
PREFILLED_SYRINGE | INTRAVENOUS | Status: AC
  Filled 2021-02-22: qty 5

## 2021-02-22 MED ORDER — SODIUM CHLORIDE 0.9% FLUSH: 3.0000 mL | INTRAVENOUS | Status: DC | PRN

## 2021-02-22 MED ORDER — DEXMEDETOMIDINE (PRECEDEX) IN NS 20 MCG/5ML (4 MCG/ML) IV SYRINGE
PREFILLED_SYRINGE | INTRAVENOUS | Status: DC | PRN
  Administered 2021-02-22: 12 ug via INTRAVENOUS
  Administered 2021-02-22: 20 ug via INTRAVENOUS

## 2021-02-22 MED ORDER — ACETAMINOPHEN 650 MG RE SUPP: 650.0000 mg | RECTAL | Status: DC | PRN

## 2021-02-22 MED ORDER — ONDANSETRON HCL 4 MG/2ML IJ SOLN
INTRAMUSCULAR | Status: DC | PRN
  Administered 2021-02-22: 4 mg via INTRAVENOUS

## 2021-02-22 SURGICAL SUPPLY — 40 items
BUR ROUND FLUTED 4 SOFT TCH (BURR) ×2 IMPLANT
CANISTER SUCT 3000ML PPV (MISCELLANEOUS) ×2 IMPLANT
COVER SURGICAL LIGHT HANDLE (MISCELLANEOUS) ×2 IMPLANT
COVER WAND RF STERILE (DRAPES) ×2 IMPLANT
DERMABOND ADHESIVE PROPEN (GAUZE/BANDAGES/DRESSINGS) ×1
DERMABOND ADVANCED (GAUZE/BANDAGES/DRESSINGS) ×1
DERMABOND ADVANCED .7 DNX12 (GAUZE/BANDAGES/DRESSINGS) ×1 IMPLANT
DERMABOND ADVANCED .7 DNX6 (GAUZE/BANDAGES/DRESSINGS) ×1 IMPLANT
DRAPE MICROSCOPE LEICA (MISCELLANEOUS) ×2 IMPLANT
DRSG MEPILEX BORDER 4X8 (GAUZE/BANDAGES/DRESSINGS) ×2 IMPLANT
DURAPREP 26ML APPLICATOR (WOUND CARE) ×2 IMPLANT
DURASEAL SPINE SEALANT 3ML (MISCELLANEOUS) ×2 IMPLANT
ELECT REM PT RETURN 9FT ADLT (ELECTROSURGICAL) ×2
ELECTRODE REM PT RTRN 9FT ADLT (ELECTROSURGICAL) ×1 IMPLANT
GAUZE SPONGE 4X4 12PLY STRL (GAUZE/BANDAGES/DRESSINGS) ×2 IMPLANT
GLOVE ORTHO TXT STRL SZ7.5 (GLOVE) ×4 IMPLANT
GLOVE SRG 8 PF TXTR STRL LF DI (GLOVE) ×2 IMPLANT
GLOVE SURG UNDER POLY LF SZ8 (GLOVE) ×4
GOWN STRL REUS W/ TWL LRG LVL3 (GOWN DISPOSABLE) ×1 IMPLANT
GOWN STRL REUS W/ TWL XL LVL3 (GOWN DISPOSABLE) ×1 IMPLANT
GOWN STRL REUS W/TWL 2XL LVL3 (GOWN DISPOSABLE) ×2 IMPLANT
GOWN STRL REUS W/TWL LRG LVL3 (GOWN DISPOSABLE) ×2
GOWN STRL REUS W/TWL XL LVL3 (GOWN DISPOSABLE) ×2
KIT BASIN OR (CUSTOM PROCEDURE TRAY) ×2 IMPLANT
KIT TURNOVER KIT B (KITS) ×2 IMPLANT
NEEDLE SPNL 18GX3.5 QUINCKE PK (NEEDLE) ×2 IMPLANT
NS IRRIG 1000ML POUR BTL (IV SOLUTION) ×2 IMPLANT
PACK LAMINECTOMY ORTHO (CUSTOM PROCEDURE TRAY) ×2 IMPLANT
PAD ARMBOARD 7.5X6 YLW CONV (MISCELLANEOUS) ×4 IMPLANT
PATTIES SURGICAL .5 X.5 (GAUZE/BANDAGES/DRESSINGS) ×2 IMPLANT
SURGIFLO W/THROMBIN 8M KIT (HEMOSTASIS) ×2 IMPLANT
SUT VIC AB 1 CTX 36 (SUTURE) ×2
SUT VIC AB 1 CTX36XBRD ANBCTR (SUTURE) ×1 IMPLANT
SUT VIC AB 2-0 CT1 27 (SUTURE) ×2
SUT VIC AB 2-0 CT1 TAPERPNT 27 (SUTURE) ×1 IMPLANT
SUT VIC AB 3-0 X1 27 (SUTURE) ×2 IMPLANT
SYR 20ML ECCENTRIC (SYRINGE) ×2 IMPLANT
TOWEL GREEN STERILE (TOWEL DISPOSABLE) ×2 IMPLANT
TOWEL GREEN STERILE FF (TOWEL DISPOSABLE) ×2 IMPLANT
WATER STERILE IRR 1000ML POUR (IV SOLUTION) ×2 IMPLANT

## 2021-02-22 NOTE — Transfer of Care (Signed)
Immediate Anesthesia Transfer of Care Note  Patient: Charles Delgado.  Procedure(s) Performed: RIGHT L3-4 MICRODISCECTOMY,  Right L2 Partial Laminectomy. (N/A )  Patient Location: PACU  Anesthesia Type:General  Level of Consciousness: drowsy, patient cooperative and responds to stimulation  Airway & Oxygen Therapy: Patient Spontanous Breathing and Patient connected to nasal cannula oxygen  Post-op Assessment: Report given to RN and Post -op Vital signs reviewed and stable  Post vital signs: Reviewed and stable  Last Vitals:  Vitals Value Taken Time  BP    Temp    Pulse 69 02/22/21 1600  Resp 16 02/22/21 1600  SpO2 96 % 02/22/21 1600  Vitals shown include unvalidated device data.  Last Pain:  Vitals:   02/22/21 1200  PainSc: 4       Patients Stated Pain Goal: 2 (02/22/21 1200)  Complications: No complications documented.

## 2021-02-22 NOTE — Anesthesia Preprocedure Evaluation (Signed)

## 2021-02-22 NOTE — Interval H&P Note (Signed)
History and Physical Interval Note:  02/22/2021 12:49 PM  Charles Delgado.  has presented today for surgery, with the diagnosis of right L3-4 herniated nucleus pulposus free fragment.  The various methods of treatment have been discussed with the patient and family. After consideration of risks, benefits and other options for treatment, the patient has consented to  Procedure(s): RIGHT L3-4 MICRODISCECTOMY (N/A) as a surgical intervention.  The patient's history has been reviewed, patient examined, no change in status, stable for surgery.  I have reviewed the patient's chart and labs.  Questions were answered to the patient's satisfaction.     Eldred Manges

## 2021-02-22 NOTE — H&P (Signed)
Charles Delgado. is an 50 y.o. male.   Chief Complaint: low back pain and right LE radiculopathy HPI: 50 year old black male history of right L3-4 HNP/free fragment, low back pain and right lower extremity radiculopathy with weakness comes in for preop evaluation.  States that symptoms unchanged from previous visit.  He is wanting to proceed with right L3-4 microdiscectomy as scheduled.  Today history physical performed.  Past Medical History:  Diagnosis Date  . Distal radius fracture, right   . Enlarged prostate   . Medical history non-contributory     Past Surgical History:  Procedure Laterality Date  . CARPAL TUNNEL RELEASE Right 06/22/2016   Procedure: CARPAL TUNNEL RELEASE,right;  Surgeon: Dairl Ponder, MD;  Location: [facility];  Service: Orthopedics;  Laterality: Right;  . COLONOSCOPY    . HERNIA REPAIR     as baby  . OPEN REDUCTION INTERNAL FIXATION (ORIF) DISTAL RADIAL FRACTURE Right 06/22/2016   Procedure: OPEN REDUCTION INTERNAL FIXATION (ORIF) DISTAL RADIAL FRACTURE,right;  Surgeon: Dairl Ponder, MD;  Location: [facility];  Service: Orthopedics;  Laterality: Right;  Axillary block    Family History  Problem Relation Age of Onset  . Prostate cancer Maternal Grandfather   . Breast cancer Mother    Social History:  reports that he quit smoking about 2 months ago. His smoking use included cigarettes. He smoked 0.50 packs per day. He has never used smokeless tobacco. He reports that he does not drink alcohol and does not use drugs.  Allergies:  Allergies  Allergen Reactions  . Penicillins Hives    Has patient had a PCN reaction causing immediate rash, facial/tongue/throat swelling, SOB or lightheadedness with hypotension: Yes Has patient had a PCN reaction causing severe rash involving mucus membranes or skin necrosis: No Has patient had a PCN reaction that required hospitalization No Has patient had a PCN reaction occurring within the  last 10 years: No If all of the above answers are "NO", then may proceed with Cephalosporin use.     No medications prior to admission.    No results found for this or any previous visit (from the past 48 hour(s)). No results found.  Review of Systems  Constitutional: Positive for activity change.  HENT: Negative.   Respiratory: Negative.   Cardiovascular: Negative.   Genitourinary: Negative.   Musculoskeletal: Positive for back pain and gait problem.  Neurological: Positive for numbness.  Psychiatric/Behavioral: Negative.     There were no vitals taken for this visit. Physical Exam HENT:     Head: Normocephalic.     Nose: Nose normal.  Eyes:     Extraocular Movements: Extraocular movements intact.  Cardiovascular:     Heart sounds: Normal heart sounds.  Pulmonary:     Effort: No respiratory distress.  Abdominal:     General: There is no distension.  Musculoskeletal:        General: Tenderness present.     Cervical back: Normal range of motion.  Neurological:     Mental Status: He is alert and oriented to person, place, and time.  Psychiatric:        Mood and Affect: Mood normal.      Assessment/Plan Right L3-4 HNP, free fragment  Will proceed with right L3-4 microdiscectomy as scheduled.  All questions answered.    Zonia Kief, PA-C 02/22/2021, 7:05 AM

## 2021-02-22 NOTE — Anesthesia Postprocedure Evaluation (Signed)
Anesthesia Post Note  Patient: Charles Delgado.  Procedure(s) Performed: RIGHT L3-4 MICRODISCECTOMY,  Right L2 Partial Laminectomy. (N/A )     Patient location during evaluation: PACU Anesthesia Type: General Level of consciousness: awake and alert, patient cooperative and oriented Pain management: pain level controlled Vital Signs Assessment: post-procedure vital signs reviewed and stable Respiratory status: spontaneous breathing, nonlabored ventilation and respiratory function stable Cardiovascular status: blood pressure returned to baseline and stable Postop Assessment: no apparent nausea or vomiting Anesthetic complications: no   No complications documented.  Last Vitals:  Vitals:   02/22/21 1641 02/22/21 1656  BP: 108/80 105/78  Pulse: 65 (!) 51  Resp: 13 14  Temp:  36.4 C  SpO2: 97% 96%    Last Pain:  Vitals:   02/22/21 1656  PainSc: 4                  Inman Fettig,E. Burr Soffer

## 2021-02-22 NOTE — Anesthesia Procedure Notes (Signed)
Procedure Name: Intubation Date/Time: 02/22/2021 2:09 PM Performed by: Janace Litten, CRNA Pre-anesthesia Checklist: Patient identified, Emergency Drugs available, Suction available and Patient being monitored Patient Re-evaluated:Patient Re-evaluated prior to induction Oxygen Delivery Method: Circle System Utilized Preoxygenation: Pre-oxygenation with 100% oxygen Induction Type: IV induction Ventilation: Mask ventilation without difficulty Laryngoscope Size: Mac and 4 Grade View: Grade I Tube type: Oral Tube size: 7.5 mm Number of attempts: 1 Airway Equipment and Method: Stylet Placement Confirmation: ETT inserted through vocal cords under direct vision,  positive ETCO2 and breath sounds checked- equal and bilateral Secured at: 23 cm Tube secured with: Tape Dental Injury: Teeth and Oropharynx as per pre-operative assessment

## 2021-02-22 NOTE — Discharge Instructions (Signed)
Ok to shower 5 days postop.  Do not apply any creams or ointments to incision.  Do not remove steri-strips.  Can use 4x4 gauze and tape for dressing changes.  No aggressive activity.  No lifting, twisting, bending, squatting or prolonged sitting.  Mostly be in reclined position or lying down.    No driving

## 2021-02-22 NOTE — Op Note (Signed)
Preop diagnosis: Right L3-4 HNP with cephalad migrated free fragment and right L3 and L4 nerve root compression.  Postop diagnosis: Same  Procedure: Right L3 hemilaminectomy.  Partial L2 right laminotomy, right L4 laminotomy.  Right L3-4 microdiscectomy and removal of cephalad migrated free fragment.  Surgeon: Ophelia Charter MD  Assistant: Zonia Kief, PA-C medically necessary and present for the entire procedure  Anesthesia: General plus Marcaine skin local  Findings large free fragment migrated cephalad from the L3-4 level extending all the way up to the next disc space requiring complete right L3 laminectomy partial L4 and partial L2 laminectomies for adequate decompression.  Disc protrusion L3-4 right with migrated free fragment extending cephalad to the L3 pedicle on the right.  Procedure after induction of general anesthesia orotracheal ovation patient placed prone on chest rolls Ancef prophylaxis careful padding positioning arms at 9090 1010 drape applied at S1 backs prepped with DuraPrep sterile skin marker was used after scrubbing with towels Betadine Steri-Drape laminectomy sheet timeout procedure.  Crosstable lateral x-ray was placed with the needle based on palpable landmarks spinous process and iliac crest needle was placed at the L3-4 interspace and 1 at the level of the pedicle of L3.  Incision was made few millimeters to the right spinous process exposing the lamina self-retaining McCullough retractor was placed and second x-ray was taken with a freer elevator over the L3-4 disc space and the second been filled for over the area where the expected top the free fragment would be found.  Bone was marked with a purple marker and right hemilaminectomy was performed at L3.  Top of L4 and the inferior aspect of L2 was taken and thick chunks of ligament were removed.  Repeat x-ray was taken with Penfield 4 at the level of the migrated fragment.  This was a third x-ray microdiscectomy was performed at  L3-4 removing chunks of ligament there was protrusion right paracentral and top portion of for laminotomy was trimmed back to open up the pedicle and free up the L4 nerve root.  Chunks of ligament were removed and bone was removed at the lateral wall of the pedicle.  Axilla of the nerve root was inspected carefully #4 Nicholos Johns was placed.  Multiple epidural veins that were engorged from some blockage from the migrated fragment were coagulated with the bipolar.  Carefully using the ball-tipped nerve root initially 2 small fragments were teased out and then continued work both above and below the nerve root up at the level of the L2-3 disc space and back below the L3 nerve root until finally ball-tipped nerve root was able to span a large fragment that pulled out decompressing the nerve root completely.  Passes were able to be made with the [facility] across the midline above and below the nerve root with no areas remaining compression.  Repeat passes in the disc base with the use of the Epstein micropituitary straight regular pituitaries were performed until there was good decompression.  After irrigation with saline solution some Surgi-Flo been used in the gutters.  Repeat cauterization epidural veins.  Operative gutter was dry there is no remaining compression dura was round nerve root was free.  Archer Asa was removed and fascia was closed with #1 Vicryl 2-0 Vicryl subtenons tissue skin closure postop dressing and transfer the care room.

## 2021-02-23 ENCOUNTER — Encounter (HOSPITAL_COMMUNITY): Payer: Self-pay | Admitting: Orthopaedic Surgery

## 2021-02-23 ENCOUNTER — Encounter: Payer: BC Managed Care – PPO | Admitting: Orthopaedic Surgery

## 2021-02-23 DIAGNOSIS — M5126 Other intervertebral disc displacement, lumbar region: Secondary | ICD-10-CM | POA: Diagnosis not present

## 2021-02-23 MED ORDER — GABAPENTIN 100 MG PO CAPS
100.0000 mg | ORAL_CAPSULE | Freq: Two times a day (BID) | ORAL | Status: DC
  Administered 2021-02-23 – 2021-02-24 (×3): 100 mg via ORAL
  Filled 2021-02-23 (×3): qty 1

## 2021-02-23 MED ORDER — KETOROLAC TROMETHAMINE 30 MG/ML IJ SOLN
30.0000 mg | Freq: Four times a day (QID) | INTRAMUSCULAR | Status: DC | PRN
  Administered 2021-02-23 – 2021-02-24 (×4): 30 mg via INTRAVENOUS
  Filled 2021-02-23 (×4): qty 1

## 2021-02-23 NOTE — Evaluation (Signed)
Physical Therapy Evaluation Patient Details Name: Charles Delgado. MRN: [id number] DOB: 14-Jul-1971 Today's Date: 02/23/2021   History of Present Illness  50 yo male s/p L3-4 microdiscectomy and R L2 partial lami on 02/22/2021. PMH including carpal tunnel release and ORIF of radial fx (2017).  Clinical Impression  Pt admitted with above diagnosis. At the time of PT eval, pt was able to demonstrate transfers and ambulation with gross min guard assist to supervision for safety. Pt was educated on precautions, appropriate activity progression, and car transfer. Pt currently with functional limitations due to the deficits listed below (see PT Problem List). Pt will benefit from skilled PT to increase their independence and safety with mobility to allow discharge to the venue listed below.     Follow Up Recommendations No PT follow up;Supervision for mobility/OOB    Equipment Recommendations  Rolling walker with 5" wheels    Recommendations for Other Services       Precautions / Restrictions Precautions Precautions: Back;Fall Precaution Booklet Issued: Yes (comment) Precaution Comments: Reviewed back precautions and compensatory techniques for ADLs Required Braces or Orthoses: Other Brace Other Brace: No brace per MD Restrictions Weight Bearing Restrictions: No      Mobility  Bed Mobility Overal bed mobility: Needs Assistance Bed Mobility: Rolling;Sit to Sidelying Rolling: Min guard Sidelying to sit: Min guard     Sit to sidelying: Min guard General bed mobility comments: VC's for hand placement on seated surface for safety and for wide BOS.    Transfers Overall transfer level: Needs assistance Equipment used: Rolling walker (2 wheeled) Transfers: Sit to/from Stand Sit to Stand: Min guard;Supervision         General transfer comment: VC's for sequencing and general safety with RW and cues for longer step/stride length. Pt limited by pain but was motivated for  distance.  Ambulation/Gait Ambulation/Gait assistance: Min guard;Supervision Gait Distance (Feet): 200 Feet Assistive device: Rolling walker (2 wheeled) Gait Pattern/deviations: Step-through pattern;Decreased stride length;Drifts right/left;Trunk flexed;Narrow base of support Gait velocity: Decreased Gait velocity interpretation: <1.31 ft/sec, indicative of household ambulator General Gait Details: Slow and guarded with generally flexed trunk. Pt with short step/stride length and difficulty making corrective changes due to pain.  Stairs            Wheelchair Mobility    Modified Rankin (Stroke Patients Only)       Balance Overall balance assessment: Needs assistance Sitting-balance support: Feet supported;No upper extremity supported Sitting balance-Leahy Scale: Good     Standing balance support: No upper extremity supported;During functional activity;Bilateral upper extremity supported Standing balance-Leahy Scale: Fair                               Pertinent Vitals/Pain Pain Assessment: Faces Faces Pain Scale: Hurts whole lot Pain Location: Back, R hip Pain Descriptors / Indicators: Discomfort;Grimacing;Moaning;Constant Pain Intervention(s): Limited activity within patient's tolerance;Monitored during session;Repositioned    Home Living Family/patient expects to be discharged to:: Private residence Living Arrangements: Other (Comment) (Plans to dc to "son's mother's home") Available Help at Discharge: Friend(s);Available 24 hours/day Type of Home: House Home Access: Stairs to enter   [employer] of Steps: 1 Home Layout: Able to live on main level with bedroom/bathroom Home Equipment: Walker - standard;Shower seat      Prior Function Level of Independence: Independent         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    Cervical / Trunk Assessment Cervical / Trunk Assessment: Other exceptions Cervical / Trunk Exceptions: s/p back sx  Communication   Communication: No difficulties  Cognition Arousal/Alertness: Awake/alert Behavior During Therapy: WFL for tasks assessed/performed Overall Cognitive Status: Within Functional Limits for tasks assessed                                        General Comments      Exercises     Assessment/Plan    PT Assessment Patient needs continued PT services  PT Problem List Decreased strength;Decreased activity tolerance;Decreased balance;Decreased mobility;Decreased knowledge of use of DME;Decreased knowledge of precautions;Decreased safety awareness;Pain       PT Treatment Interventions DME instruction;Gait training;Functional mobility training;Therapeutic activities;Therapeutic exercise;Neuromuscular re-education;Patient/family education    PT Goals (Current goals can be found in the Care Plan section)  Acute Rehab PT Goals Patient Stated Goal: Stop pain PT Goal Formulation: With patient Time For Goal Achievement: 03/02/21 Potential to Achieve Goals: Good    Frequency Min 5X/week   Barriers to discharge        Co-evaluation   Reason for Co-Treatment:  (Dove tail for pain amangement)           AM-PAC PT "6 Clicks" Mobility  Outcome Measure Help needed turning from your back to your side while in a flat bed without using bedrails?: None Help needed moving from lying on your back to sitting on the side of a flat bed without using bedrails?: A Little Help needed moving to and from a bed to a chair (including a wheelchair)?: A Little Help needed standing up  from a chair using your arms (e.g., wheelchair or bedside chair)?: A Little Help needed to walk in hospital room?: A Little Help needed climbing 3-5 steps with a railing? : A Little 6 Click Score: 19    End of Session Equipment Utilized During Treatment: Gait belt Activity Tolerance: Patient tolerated treatment well Patient left: in bed;with call bell/phone within reach Nurse Communication: Mobility status PT Visit Diagnosis: Unsteadiness on feet (R26.81);Pain Pain - Right/Left: Right Pain - part of body: Hip    Time: 0759-0825 PT Time Calculation (min) (ACUTE ONLY): 26 min   Charges:   PT Evaluation $PT Eval Low Complexity: 1 Low PT Treatments $Gait Training: 8-22 mins        Conni Slipper, PT, DPT Acute Rehabilitation Services Pager: [phone] Office: [phone]   Marylynn Pearson 02/23/2021, 11:06 AM

## 2021-02-23 NOTE — Evaluation (Signed)
Occupational Therapy Evaluation Patient Details Name: Charles Delgado. MRN: [id number] DOB: [birth year] Today's Date: 02/23/2021    History of Present Illness 50 yo male s/p L3-4 microdiscectomy and R L2 partial lami. 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 Plans to dc to son's mother's house for increased support. Currently, pt requires Supervision-Min Guard A for ADLs and functional mobility using RW. Provided education and handout on back precautions, grooming, bed mobility, LB ADLs with AE, and toileting; pt demonstrated understanding. Pt currently limited by significant pain impacting his functional performance and safety. Pt would benefit from further acute OT to facilitate safe dc. Recommend dc to home once medically stable per physician.     Follow Up Recommendations  No OT follow up    Equipment Recommendations  3 in 1 bedside commode    Recommendations for Other Services PT consult     Precautions / Restrictions Precautions Precautions: Back Precaution Booklet Issued: Yes (comment) Precaution Comments: Reviewed back precautions and compensatory techniques for ADLs Required Braces or Orthoses: Other Brace Other Brace: No brace per MD      Mobility Bed Mobility Overal bed mobility: Needs Assistance Bed Mobility: Rolling;Sidelying to Sit Rolling: Min guard Sidelying to sit: Min guard       General bed mobility comments: MIn GUard A for safety. Max cues for log roll techniques    Transfers Overall transfer level: Needs assistance Equipment used: Rolling walker (2 wheeled) Transfers: Sit to/from Stand Sit to Stand: Min guard;Supervision         General transfer comment: Supervision-MIn Guard A for safety    Balance Overall balance assessment: Needs assistance Sitting-balance support: Feet supported;No upper extremity supported Sitting balance-Leahy  Scale: Good     Standing balance support: No upper extremity supported;During functional activity;Bilateral upper extremity supported Standing balance-Leahy Scale: Fair                             ADL either performed or assessed with clinical judgement   ADL Overall ADL's : Needs assistance/impaired Eating/Feeding: Set up;Bed level   Grooming: Min guard;Standing Grooming Details (indicate cue type and reason): Educating pt on use of two cups for spitting nad rinsing Upper Body Bathing: Set up;Supervision/ safety;Sitting   Lower Body Bathing: Min guard;Sit to/from stand   Upper Body Dressing : Set up;Supervision/safety;Sitting   Lower Body Dressing: Min guard;Sit to/from stand;With adaptive equipment Lower Body Dressing Details (indicate cue type and reason): Use of reacher to don underwear Toilet Transfer: Min guard;Ambulation;RW (simulated to recliner)   Toileting- Clothing Manipulation and Hygiene: Min guard;Sit to/from stand       Functional mobility during ADLs: Min guard;Rolling walker;Supervision/safety General ADL Comments: Providing education on back precautions, bed mobility, grooming, LB ADLs, and functional transfers. Pt motivated but impacted by significant pain     Vision         Perception     Praxis      Pertinent Vitals/Pain Pain Assessment: Faces Faces Pain Scale: Hurts whole lot Pain Location: Back Pain Descriptors / Indicators: Discomfort;Grimacing;Moaning;Constant Pain Intervention(s): Monitored during session;Limited activity within patient's tolerance;Repositioned     Hand Dominance     Extremity/Trunk Assessment Upper Extremity Assessment Upper Extremity Assessment: Overall WFL for tasks assessed   Lower Extremity Assessment Lower Extremity Assessment: Defer to PT evaluation   Cervical / Trunk Assessment  Cervical / Trunk Assessment: Other exceptions Cervical / Trunk Exceptions: s/p back sx   Communication  Communication Communication: No difficulties   Cognition Arousal/Alertness: Awake/alert Behavior During Therapy: WFL for tasks assessed/performed Overall Cognitive Status: Within Functional Limits for tasks assessed                                     General Comments       Exercises     Shoulder Instructions      Home Living Family/patient expects to be discharged to:: Private residence Living Arrangements: Other (Comment) (Plans to dc to "son's mother's home") Available Help at Discharge: Friend(s);Available 24 hours/day Type of Home: House Home Access: Stairs to enter [employer] of Steps: 1   Home Layout: Able to live on main level with bedroom/bathroom     Bathroom Shower/Tub: Arts development officer Toilet: Handicapped height     Home Equipment: Walker - standard;Shower seat          Prior Functioning/Environment Level of Independence: Independent        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)        OT Problem List: Decreased range of motion;Decreased activity tolerance;Impaired balance (sitting and/or standing);Decreased knowledge of use of DME or AE;Decreased knowledge of precautions;Pain      OT Treatment/Interventions: Therapeutic exercise;Self-care/ADL training;Energy conservation;DME and/or AE instruction;Therapeutic activities;Patient/family education    OT Goals(Current goals can be found in the care plan section) Acute Rehab OT Goals Patient Stated Goal: Stop pain OT Goal Formulation: With patient Time For Goal Achievement: 03/09/21 Potential to Achieve Goals: Good  OT Frequency: Min 2X/week   Barriers to D/C:            Co-evaluation PT/OT/SLP Co-Evaluation/Treatment: Yes Reason for Co-Treatment:  (Dove tail for pain amangement)          AM-PAC OT "6 Clicks" Daily Activity     Outcome Measure Help from another person eating meals?: None Help from another person taking care  of personal grooming?: A Little Help from another person toileting, which includes using toliet, bedpan, or urinal?: A Little Help from another person bathing (including washing, rinsing, drying)?: A Little Help from another person to put on and taking off regular upper body clothing?: A Little Help from another person to put on and taking off regular lower body clothing?: A Little 6 Click Score: 19   End of Session Equipment Utilized During Treatment: Rolling walker Nurse Communication: Mobility status  Activity Tolerance: Patient tolerated treatment well Patient left:  (in hallway with PT)  OT Visit Diagnosis: Unsteadiness on feet (R26.81);Other abnormalities of gait and mobility (R26.89);Muscle weakness (generalized) (M62.81);Pain Pain - Right/Left: Right Pain - part of body: Hip (Back)                Time: 2202-5427 OT Time Calculation (min): 22 min Charges:  OT General Charges $OT Visit: 1 Visit OT Evaluation $OT Eval Low Complexity: 1 Low  Ajiah Mcglinn MSOT, OTR/L Acute Rehab Pager: [phone] Office: [phone]  Theodoro Grist Zamari Vea 02/23/2021, 9:39 AM

## 2021-02-23 NOTE — Progress Notes (Signed)
   Subjective: 1 Day Post-Op Procedure(s) (LRB): RIGHT L3-4 MICRODISCECTOMY,  Right L2 Partial Laminectomy. (N/A) Patient reports pain as moderate and severe.    Objective: Vital signs in last 24 hours: Temp:  [97.3 F (36.3 C)-98.6 F (37 C)] 98 F (36.7 C) (05/03 0252) Pulse Rate:  [51-87] 80 (05/03 0252) Resp:  [10-20] 16 (05/03 0252) BP: (101-138)/(74-98) 115/84 (05/03 0252) SpO2:  [92 %-100 %] 92 % (05/03 0252) Weight:  [90.7 kg] 90.7 kg (05/02 1134)  Intake/Output from previous day: 05/02 0701 - 05/03 0700 In: 1240 [P.O.:240; I.V.:1000] Out: 350 [Urine:300; Blood:50] Intake/Output this shift: No intake/output data recorded.  Recent Labs    02/22/21 1200  HGB 14.9   Recent Labs    02/22/21 1200  WBC 5.0  RBC 4.87  HCT 45.3  PLT 274   Recent Labs    02/22/21 1200  NA 142  K 4.4  CL 108  CO2 25  BUN 13  CREATININE 1.24  GLUCOSE 99  CALCIUM 9.7   No results for input(s): LABPT, INR in the last 72 hours.  Neurologically intact DG Lumbar Spine 2-3 Views  Result Date: 02/22/2021 CLINICAL DATA:  L3-4 micro discectomy EXAM: LUMBAR SPINE - 2-3 VIEW COMPARISON:  01/12/2021 FINDINGS: 3 cross-table lateral images are obtained during the performance of the procedure. Initial images demonstrates surgical instrumentation posterior to the L3 and L4 vertebral bodies. The second image demonstrates instrumentation posterior to the L3 vertebral body. The final image demonstrates instrumentation posterior to the L2 and L3 levels. Alignment is anatomic. IMPRESSION: 1. Intraoperative localization films of the lumbar spine as above. Electronically Signed   By: Sharlet Salina M.D.   On: 02/22/2021 16:56    Assessment/Plan: 1 Day Post-Op Procedure(s) (LRB): RIGHT L3-4 MICRODISCECTOMY,  Right L2 Partial Laminectomy. (N/A) Up with therapy. Quad strong. Groin pain gone. Sensation right anterior thigh back to normal. Lumbar spasms and hip pain when he tries to walk.  Will dose  toradol and try some neurontin. Home today if mobile safely Charles Delgado 02/23/2021, 7:33 AM

## 2021-02-24 ENCOUNTER — Telehealth: Payer: Self-pay

## 2021-02-24 DIAGNOSIS — M5126 Other intervertebral disc displacement, lumbar region: Secondary | ICD-10-CM | POA: Diagnosis not present

## 2021-02-24 MED ORDER — ALUM & MAG HYDROXIDE-SIMETH 200-200-20 MG/5ML PO SUSP
30.0000 mL | Freq: Four times a day (QID) | ORAL | Status: DC | PRN
  Administered 2021-02-24: 30 mL via ORAL
  Filled 2021-02-24: qty 30

## 2021-02-24 MED ORDER — OXYCODONE-ACETAMINOPHEN 7.5-325 MG PO TABS: 1.0000 | ORAL_TABLET | Freq: Four times a day (QID) | ORAL | 0 refills | Status: DC | PRN

## 2021-02-24 MED ORDER — METHOCARBAMOL 500 MG PO TABS: 500.0000 mg | ORAL_TABLET | Freq: Four times a day (QID) | ORAL | 0 refills | Status: DC

## 2021-02-24 NOTE — Telephone Encounter (Signed)
Pt called and stated he needed [place] to call his pharmacy I called his pharmacy and they stated his insurance requires [place] for Oxycodone since last rx was filled within 7 day period I looked into his chart and pt is WC approved for surgery. I confirmed this with Morrison Old and she advised me to tell pt to call his [facility] adjuster @ [phone] I called pt back and informed him to reach out to his adjustor to discuss payment for medication with WC. He stated understanding

## 2021-02-24 NOTE — Progress Notes (Signed)
Occupational Therapy Treatment Patient Details Name: Charles Delgado. MRN: [id number] DOB: 1971/04/27 Today's Date: 02/24/2021    History of present illness Pt is a 50 y/o male s/p L3-4 microdiscectomy and R L2 partial lami on 02/22/2021. PMH including carpal tunnel release and ORIF of radial fx (2017).   OT comments  Pt making steady progress towards OT goals this session. Observed pt walking in hallway with PT with no AD and gross supervision. Pt received EOB after PT session with session focusing on LB AE and compensatory methods for for maintaining back precautions during ADLs. Pt currently requires set- up - supervision for LB ADLs with AE. Pt very pleasant and receptive to all education. Pt likely to DC home today to his sons mothers house. Dc plan remains appropriate, will follow acutely per POC.    Follow Up Recommendations  No OT follow up    Equipment Recommendations  3 in 1 bedside commode    Recommendations for Other Services      Precautions / Restrictions Precautions Precautions: Back;Fall Precaution Booklet Issued: No Precaution Comments: Verbally reviewed precautions during functional mobility. Overall good adherence. Required Braces or Orthoses: Other Brace Other Brace: No brace per MD Restrictions Weight Bearing Restrictions: No       Mobility Bed Mobility    Transfers    Balance Overall balance assessment: Needs assistance Sitting-balance support: Feet supported;No upper extremity supported Sitting balance-Leahy Scale: Good Sitting balance - Comments: sitting EOB with no UE support for LB ADLs                                ADL either performed or assessed with clinical judgement   ADL Overall ADL's : Needs assistance/impaired       Grooming Details (indicate cue type and reason): reviewed compensatory methods for grooming tasks       Lower Body Bathing Details (indicate cue type and reason): education provided on AE for LB bathing  with LH sponge     Lower Body Dressing: Supervision/safety;Set up;With adaptive equipment;Cueing for sequencing;Sitting/lateral leans Lower Body Dressing Details (indicate cue type and reason): pt able to don socks with sock aid with supervision- set- up assist and cues for sequencing novel task. demo'ed use of reacher to don pants with pt verbalizing understanding       Toileting - Clothing Manipulation Details (indicate cue type and reason): education provided on compensatory methods for posterior pericare in order to maintain precautions   Tub/Shower Transfer Details (indicate cue type and reason): pt reports walkin shower with built in seat   General ADL Comments: session focus on education related to all LB AE and compensatory methods for maintaining precautions during ADLs. pt with improved pain tolerance this session able to demo LB ADLs and observed pt walking in hallway with no AD and gross supervision. pt likely to DC home to his sons mothers house today     Vision       Perception     Praxis      Cognition Arousal/Alertness: Awake/alert Behavior During Therapy: WFL for tasks assessed/performed Overall Cognitive Status: Within Functional Limits for tasks assessed                                          Exercises     Shoulder Instructions  General Comments issued pt LB AE for bathing and dressing    Pertinent Vitals/ Pain       Pain Assessment: 0-10 Pain Score: 3  Faces Pain Scale: No hurt Pain Location: Incision site. 0/10 pain in R hip unless taking long strides, then reports it is a 6/10. Pain Descriptors / Indicators: Discomfort;Constant;Operative site guarding Pain Intervention(s): Limited activity within patient's tolerance;Monitored during session;Repositioned  Home Living                                          Prior Functioning/Environment              Frequency  Min 2X/week        Progress  Toward Goals  OT Goals(current goals can now be found in the care plan section)  Progress towards OT goals: Progressing toward goals  Acute Rehab OT Goals Patient Stated Goal: to go home today OT Goal Formulation: With patient Time For Goal Achievement: 03/09/21 Potential to Achieve Goals: Good  Plan Discharge plan remains appropriate;Frequency remains appropriate    Co-evaluation                 AM-PAC OT "6 Clicks" Daily Activity     Outcome Measure   Help from another person eating meals?: None Help from another person taking care of personal grooming?: None Help from another person toileting, which includes using toliet, bedpan, or urinal?: A Little Help from another person bathing (including washing, rinsing, drying)?: A Little Help from another person to put on and taking off regular upper body clothing?: None Help from another person to put on and taking off regular lower body clothing?: A Little 6 Click Score: 21    End of Session Equipment Utilized During Treatment: Other (comment) (LB AE)  OT Visit Diagnosis: Unsteadiness on feet (R26.81);Other abnormalities of gait and mobility (R26.89);Muscle weakness (generalized) (M62.81)   Activity Tolerance Patient tolerated treatment well   Patient Left in bed;with call bell/phone within reach;Other (comment) (sitting EOB)   Nurse Communication Mobility status        Time: 0086-7619 OT Time Calculation (min): 13 min  Charges: OT General Charges $OT Visit: 1 Visit OT Treatments $Self Care/Home Management : 8-22 mins  Lenor Derrick., COTA/L Acute Rehabilitation Services [phone]    [name] 02/24/2021, 9:30 AM

## 2021-02-24 NOTE — Progress Notes (Signed)
Changed patient's dressing per Md order prior to d/c.

## 2021-02-24 NOTE — Progress Notes (Signed)
Physical Therapy Treatment and Discharge Patient Details Name: Charles Delgado. MRN: [id number] DOB: Mar 10, 1971 Today's Date: 02/24/2021    History of Present Illness Pt is a 50 y/o male s/p L3-4 microdiscectomy and R L2 partial lami on 02/22/2021. PMH including carpal tunnel release and ORIF of radial fx (2017).    PT Comments    Pt progressing well with post-op mobility. He was able to demonstrate transfers and ambulation with gross modified independence and occasional supervision for safety. Reinforced education on precautions, appropriate activity progression, and car transfer. Pt has met acute PT goals and we will sign off at this time. If needs change, please reconsult.     Follow Up Recommendations  No PT follow up;Supervision for mobility/OOB     Equipment Recommendations  Rolling walker with 5" wheels    Recommendations for Other Services       Precautions / Restrictions Precautions Precautions: Back;Fall Precaution Booklet Issued: No Precaution Comments: Verbally reviewed precautions during functional mobility. Overall good adherence. Required Braces or Orthoses: Other Brace Other Brace: No brace per MD Restrictions Weight Bearing Restrictions: No    Mobility  Bed Mobility Overal bed mobility: Modified Independent Bed Mobility: Rolling;Sidelying to Sit           General bed mobility comments: Pt demonstrated proper log roll technique without assist or cues. HOB flat and min use of rails.    Transfers Overall transfer level: Modified independent Equipment used: No Assistive Device Transfers: Sit to/from Stand           General transfer comment: VC's for sequencing and general safety with RW and cues for longer step/stride length. Pt limited by pain but was motivated for distance.  Ambulation/Gait Ambulation/Gait assistance: Supervision;Modified independent (Device/Increase time)   Assistive device: No AD Gait Pattern/deviations: Step-through  pattern;Decreased stride length;Drifts right/left;Trunk flexed;Narrow base of support Gait velocity: Decreased Gait velocity interpretation: 1.31 - 2.62 ft/sec, indicative of limited community ambulator General Gait Details: Slow and guarded with generally flexed trunk. Pt with short step/stride length and difficulty making corrective changes due to pain.   Stairs             Wheelchair Mobility    Modified Rankin (Stroke Patients Only)       Balance Overall balance assessment: Needs assistance Sitting-balance support: Feet supported;No upper extremity supported Sitting balance-Leahy Scale: Good Sitting balance - Comments: sitting EOB with no UE support for LB ADLs   Standing balance support: No upper extremity supported;During functional activity;Bilateral upper extremity supported Standing balance-Leahy Scale: Fair                              Cognition Arousal/Alertness: Awake/alert Behavior During Therapy: WFL for tasks assessed/performed Overall Cognitive Status: Within Functional Limits for tasks assessed                                        Exercises      General Comments General comments (skin integrity, edema, etc.): issued pt LB AE for bathing and dressing      Pertinent Vitals/Pain Pain Assessment: 0-10 Pain Score: 3  Faces Pain Scale: No hurt Pain Location: Incision site. 0/10 pain in R hip unless taking long strides, then reports it is a 6/10. Pain Descriptors / Indicators: Discomfort;Constant;Operative site guarding Pain Intervention(s): Limited activity within patient's tolerance;Monitored during session;Repositioned  Home Living                      Prior Function            PT Goals (current goals can now be found in the care plan section) Acute Rehab PT Goals Patient Stated Goal: Home today, be able to stay by himself PT Goal Formulation: With patient Time For Goal Achievement:  03/02/21 Potential to Achieve Goals: Good Progress towards PT goals: Progressing toward goals    Frequency    Min 5X/week      PT Plan Current plan remains appropriate    Co-evaluation              AM-PAC PT "6 Clicks" Mobility   Outcome Measure  Help needed turning from your back to your side while in a flat bed without using bedrails?: None Help needed moving from lying on your back to sitting on the side of a flat bed without using bedrails?: A Little Help needed moving to and from a bed to a chair (including a wheelchair)?: A Little Help needed standing up from a chair using your arms (e.g., wheelchair or bedside chair)?: A Little Help needed to walk in hospital room?: A Little Help needed climbing 3-5 steps with a railing? : A Little 6 Click Score: 19    End of Session Equipment Utilized During Treatment: Gait belt Activity Tolerance: Patient tolerated treatment well Patient left: in bed;with call bell/phone within reach Nurse Communication: Mobility status PT Visit Diagnosis: Unsteadiness on feet (R26.81);Pain Pain - Right/Left: Right Pain - part of body: Hip     Time: 0811-0822 PT Time Calculation (min) (ACUTE ONLY): 11 min  Charges:  $Gait Training: 8-22 mins                     Rolinda Roan, PT, DPT Acute Rehabilitation Services Pager: [phone] Office: [phone]    Thelma Comp 02/24/2021, 9:17 AM

## 2021-02-24 NOTE — Progress Notes (Signed)
NURSING PROGRESS NOTE  Charles Delgado [id number] Discharge Data: 02/24/2021 3:22 PM Attending Provider: Eldred Manges, MD PCP:[facility], [facility]     Alean Rinne. to be D/C'd Home per MD order.  Discussed with the patient the After Visit Summary and all questions fully answered. All IV's discontinued with no bleeding noted. All belongings returned to patient for patient to take home. 3 in 1 equipment sent home with the patient.  Last Vital Signs:  Blood pressure (!) 131/92, pulse 83, temperature 98.2 F (36.8 C), temperature source Oral, resp. rate 18, height 5' 7.5" (1.715 m), weight 90.7 kg, SpO2 97 %.  Discharge Medication List Allergies as of 02/24/2021      Reactions   Penicillins Hives   Has patient had a PCN reaction causing immediate rash, facial/tongue/throat swelling, SOB or lightheadedness with hypotension: Yes Has patient had a PCN reaction causing severe rash involving mucus membranes or skin necrosis: No Has patient had a PCN reaction that required hospitalization No Has patient had a PCN reaction occurring within the last 10 years: No If all of the above answers are "NO", then may proceed with Cephalosporin use.      Medication List    STOP taking these medications   HYDROcodone-acetaminophen 5-325 MG tablet Commonly known as: NORCO/VICODIN     TAKE these medications   methocarbamol 500 MG tablet Commonly known as: Robaxin Take 1 tablet (500 mg total) by mouth 4 (four) times daily.   oxyCODONE-acetaminophen 7.5-325 MG tablet Commonly known as: Percocet Take 1 tablet by mouth every 6 (six) hours as needed for severe pain.            Durable Medical Equipment  (From admission, onward)         Start     Ordered   02/24/21 0951  For home use only DME 3 n 1  Once        02/24/21 0950

## 2021-02-24 NOTE — Progress Notes (Signed)
Subjective: Doing well.  Pain controlled.  Ready to d/c home.    Objective: Vital signs in last 24 hours: Temp:  [97.6 F (36.4 C)-99.6 F (37.6 C)] 98.2 F (36.8 C) (05/04 1207) Pulse Rate:  [63-95] 83 (05/04 1207) Resp:  [18-20] 18 (05/04 1207) BP: (107-138)/(73-94) 131/92 (05/04 1207) SpO2:  [94 %-97 %] 97 % (05/04 1207)  Intake/Output from previous day: No intake/output data recorded. Intake/Output this shift: No intake/output data recorded.  Recent Labs    02/22/21 1200  HGB 14.9   Recent Labs    02/22/21 1200  WBC 5.0  RBC 4.87  HCT 45.3  PLT 274   Recent Labs    02/22/21 1200  NA 142  K 4.4  CL 108  CO2 25  BUN 13  CREATININE 1.24  GLUCOSE 99  CALCIUM 9.7   No results for input(s): LABPT, INR in the last 72 hours.  Exam Alert and oriented.  NAD.  Wound looks good.  Staples intact.     Assessment/Plan: D/c home today. Scripts sent in to pharmacy.  Charles Delgado 02/24/2021, 1:01 PM

## 2021-02-24 NOTE — Plan of Care (Signed)
  Problem: Safety: Goal: Ability to remain free from injury will improve Outcome: Adequate for Discharge   Problem: Education: Goal: Ability to verbalize activity precautions or restrictions will improve Outcome: Adequate for Discharge Goal: Knowledge of the prescribed therapeutic regimen will improve Outcome: Adequate for Discharge Goal: Understanding of discharge needs will improve Outcome: Adequate for Discharge   Problem: Activity: Goal: Ability to avoid complications of mobility impairment will improve Outcome: Adequate for Discharge Goal: Ability to tolerate increased activity will improve Outcome: Adequate for Discharge Goal: Will remain free from falls Outcome: Adequate for Discharge   

## 2021-03-02 ENCOUNTER — Encounter: Payer: Self-pay | Admitting: Orthopaedic Surgery

## 2021-03-02 ENCOUNTER — Ambulatory Visit (INDEPENDENT_AMBULATORY_CARE_PROVIDER_SITE_OTHER): Payer: No Typology Code available for payment source | Admitting: Orthopaedic Surgery

## 2021-03-02 VITALS — BP 130/82 | HR 66

## 2021-03-02 DIAGNOSIS — Z9889 Other specified postprocedural states: Secondary | ICD-10-CM

## 2021-03-02 NOTE — Discharge Summary (Signed)
Patient ID: Charles Delgado. MRN: [id number] DOB/AGE: 49-20-1972 49 y.o.  Admit date: 02/22/2021 Discharge date: 02/24/2021 Admission Diagnoses:  Active Problems:   Lumbar herniated disc   Discharge Diagnoses:  Active Problems:   Lumbar herniated disc  status post Procedure(s): RIGHT L3-4 MICRODISCECTOMY,  Right L2 Partial Laminectomy.  Past Medical History:  Diagnosis Date  . Distal radius fracture, right   . Enlarged prostate   . Medical history non-contributory     Surgeries: Procedure(s): RIGHT L3-4 MICRODISCECTOMY,  Right L2 Partial Laminectomy. on 02/22/2021   Consultants:   Discharged Condition: Improved  Hospital Course: Charles Delgado. is an 50 y.o. male who was admitted 02/22/2021 for operative treatment of lumbar HNP. Patient failed conservative treatments (please see the history and physical for the specifics) and had severe unremitting pain that affects sleep, daily activities and work/hobbies. After pre-op clearance, the patient was taken to the operating room on 02/22/2021 and underwent  Procedure(s): RIGHT L3-4 MICRODISCECTOMY,  Right L2 Partial Laminectomy..    Patient was given perioperative antibiotics:  Anti-infectives (From admission, onward)   Start     Dose/Rate Route Frequency Ordered Stop   02/22/21 1230  vancomycin (VANCOCIN) IVPB 1000 mg/200 mL premix        1,000 mg 200 mL/hr over 60 Minutes Intravenous On call to O.R. 02/22/21 1119 02/22/21 1839       Patient was given sequential compression devices and early ambulation to prevent DVT.   Patient benefited maximally from hospital stay and there were no complications. At the time of discharge, the patient was urinating/moving their bowels without difficulty, tolerating a regular diet, pain is controlled with oral pain medications and they have been cleared by PT/OT.   Recent vital signs: No data found.   Recent laboratory studies: No results for input(s): WBC, HGB, HCT, PLT, NA, K, CL, CO2,  BUN, CREATININE, GLUCOSE, INR, CALCIUM in the last 72 hours.  Invalid input(s): PT, 2   Discharge Medications:   Allergies as of 02/24/2021      Reactions   Penicillins Hives   Has patient had a PCN reaction causing immediate rash, facial/tongue/throat swelling, SOB or lightheadedness with hypotension: Yes Has patient had a PCN reaction causing severe rash involving mucus membranes or skin necrosis: No Has patient had a PCN reaction that required hospitalization No Has patient had a PCN reaction occurring within the last 10 years: No If all of the above answers are "NO", then may proceed with Cephalosporin use.      Medication List    STOP taking these medications   HYDROcodone-acetaminophen 5-325 MG tablet Commonly known as: NORCO/VICODIN     TAKE these medications   methocarbamol 500 MG tablet Commonly known as: Robaxin Take 1 tablet (500 mg total) by mouth 4 (four) times daily.   oxyCODONE-acetaminophen 7.5-325 MG tablet Commonly known as: Percocet Take 1 tablet by mouth every 6 (six) hours as needed for severe pain.       Diagnostic Studies: DG Lumbar Spine 2-3 Views  Result Date: 02/22/2021 CLINICAL DATA:  L3-4 micro discectomy EXAM: LUMBAR SPINE - 2-3 VIEW COMPARISON:  01/12/2021 FINDINGS: 3 cross-table lateral images are obtained during the performance of the procedure. Initial images demonstrates surgical instrumentation posterior to the L3 and L4 vertebral bodies. The second image demonstrates instrumentation posterior to the L3 vertebral body. The final image demonstrates instrumentation posterior to the L2 and L3 levels. Alignment is anatomic. IMPRESSION: 1. Intraoperative localization films of the lumbar spine as  above. Electronically Signed   By: Sharlet Salina M.D.   On: 02/22/2021 16:56    Discharge Instructions    Incentive spirometry RT   Complete by: As directed        Follow-up Information    Schedule an appointment as soon as possible for a visit with  Eldred Manges, MD.   Specialty: Orthopedic Surgery Why: need return office visit 2 weeks postop.  call to schedule appointment.  Contact information: [address] [place] [address] [phone]               Discharge Plan:  discharge to home  Disposition:     Signed: Zonia Kief  03/02/2021, 9:49 AM

## 2021-03-02 NOTE — Progress Notes (Signed)
   Post-Op Visit Note   Patient: Charles Delgado.           Date of Birth: 09/05/1971           MRN: [id number] Visit Date: 03/02/2021 PCP: [facility]   Assessment & Plan: Post microdiscectomy right L3-4 and right L2 hemilaminectomy.  Incision looks good new dressing applied return 1 week for suture removal.  Chief Complaint:  Chief Complaint  Patient presents with  . Lower Back - Pain   Visit Diagnoses:  1. S/P lumbar microdiscectomy     Plan: Patient is off his pain medication he can use some ibuprofen or Tylenol for pain if needed.  Gradually increase his walking.  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  Follow-Up Instructions: Return in about 1 week (around 03/09/2021).   Orders:  No orders of the defined types were placed in this encounter.  No orders of the defined types were placed in this encounter.   Imaging: No results found.  PMFS History: Patient Active Problem List   Diagnosis Date Noted  . S/P lumbar microdiscectomy 02/22/2021  . Lumbar herniated disc 01/13/2021   Past Medical History:  Diagnosis Date  . Distal radius fracture, right   . Enlarged prostate   . Medical history non-contributory     Family History  Problem Relation Age of Onset  . Prostate cancer Maternal Grandfather   . Breast cancer Mother     Past Surgical History:  Procedure Laterality Date  . CARPAL TUNNEL RELEASE Right 06/22/2016   Procedure: CARPAL TUNNEL RELEASE,right;  Surgeon: Dairl [facility], MD;  Location: [facility];  Service: Orthopedics;  Laterality: Right;  . COLONOSCOPY    . HERNIA REPAIR     as baby  . LUMBAR LAMINECTOMY N/A 02/22/2021   Procedure: RIGHT L3-4 MICRODISCECTOMY,  Right L2 Partial Laminectomy.;  Surgeon: Eldred Manges, MD;  Location: [facility];  Service: Orthopedics;  Laterality: N/A;  . OPEN REDUCTION INTERNAL FIXATION (ORIF) DISTAL RADIAL FRACTURE Right 06/22/2016   Procedure: OPEN REDUCTION INTERNAL  FIXATION (ORIF) DISTAL RADIAL FRACTURE,right;  Surgeon: Dairl Ponder, MD;  Location: [facility];  Service: Orthopedics;  Laterality: Right;  Axillary block   Social History   Occupational History  . Occupation: truck Hospital doctor  Tobacco Use  . Smoking status: Former Smoker    Packs/day: 0.50    Types: Cigarettes    Quit date: 12/22/2020    Years since quitting: 0.1  . Smokeless tobacco: Never Used  Vaping Use  . Vaping Use: Former  Substance and Sexual Activity  . Alcohol use: No  . Drug use: No  . Sexual activity: Not on file

## 2021-03-09 ENCOUNTER — Encounter: Payer: Self-pay | Admitting: Orthopaedic Surgery

## 2021-03-09 ENCOUNTER — Ambulatory Visit (INDEPENDENT_AMBULATORY_CARE_PROVIDER_SITE_OTHER): Payer: No Typology Code available for payment source | Admitting: Orthopaedic Surgery

## 2021-03-09 VITALS — BP 119/81 | HR 86

## 2021-03-09 DIAGNOSIS — Z9889 Other specified postprocedural states: Secondary | ICD-10-CM

## 2021-03-09 NOTE — Progress Notes (Signed)
   Post-Op Visit Note   Patient: Charles Delgado.           Date of Birth: 01/31/1971           MRN: [id number] Visit Date: 03/09/2021 PCP: [facility]   Assessment & Plan:Post right L3-4 microdiscectomy and partial right laminectomy.  Incision looks good staples are harvested today Steri-Strips applied.  Continue walking program.  Recheck 4 weeks.  He is off his pain medication and only takes it if he is having significant pain in the morning.  Chief Complaint:  Chief Complaint  Patient presents with  . Lower Back - Routine Post Op   Visit Diagnoses:  1. 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  Follow-Up Instructions: No follow-ups on file.   Orders:  No orders of the defined types were placed in this encounter.  No orders of the defined types were placed in this encounter.   Imaging: No results found.  PMFS History: Patient Active Problem List   Diagnosis Date Noted  . S/P lumbar microdiscectomy 02/22/2021  . Lumbar herniated disc 01/13/2021   Past Medical History:  Diagnosis Date  . Distal radius fracture, right   . Enlarged prostate   . Medical history non-contributory     Family History  Problem Relation Age of Onset  . Prostate cancer Maternal Grandfather   . Breast cancer Mother     Past Surgical History:  Procedure Laterality Date  . CARPAL TUNNEL RELEASE Right 06/22/2016   Procedure: CARPAL TUNNEL RELEASE,right;  Surgeon: Dairl Ponder, MD;  Location: [facility];  Service: Orthopedics;  Laterality: Right;  . COLONOSCOPY    . HERNIA REPAIR     as baby  . LUMBAR LAMINECTOMY N/A 02/22/2021   Procedure: RIGHT L3-4 MICRODISCECTOMY,  Right L2 Partial Laminectomy.;  Surgeon: Eldred Manges, MD;  Location: [facility];  Service: Orthopedics;  Laterality: N/A;  . OPEN REDUCTION INTERNAL FIXATION (ORIF) DISTAL RADIAL FRACTURE Right 06/22/2016   Procedure: OPEN REDUCTION INTERNAL FIXATION  (ORIF) DISTAL RADIAL FRACTURE,right;  Surgeon: Dairl Ponder, MD;  Location: [facility];  Service: Orthopedics;  Laterality: Right;  Axillary block   Social History   Occupational History  . Occupation: truck Hospital doctor  Tobacco Use  . Smoking status: Former Smoker    Packs/day: 0.50    Types: Cigarettes    Quit date: 12/22/2020    Years since quitting: 0.2  . Smokeless tobacco: Never Used  Vaping Use  . Vaping Use: Former  Substance and Sexual Activity  . Alcohol use: No  . Drug use: No  . Sexual activity: Not on file

## 2021-04-06 ENCOUNTER — Encounter: Payer: Self-pay | Admitting: Orthopaedic Surgery

## 2021-04-06 ENCOUNTER — Ambulatory Visit (INDEPENDENT_AMBULATORY_CARE_PROVIDER_SITE_OTHER): Payer: No Typology Code available for payment source | Admitting: Orthopaedic Surgery

## 2021-04-06 VITALS — BP 146/110 | HR 72 | Ht 67.5 in | Wt 200.0 lb

## 2021-04-06 DIAGNOSIS — Z9889 Other specified postprocedural states: Secondary | ICD-10-CM

## 2021-04-06 NOTE — Progress Notes (Signed)
   Post-Op Visit Note   Patient: Charles Delgado.           Date of Birth: 1971-03-29           MRN: [id number] Visit Date: 04/06/2021 PCP: [facility]   Assessment & Plan: Post microdiscectomy and removal of large free fragment.  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  I have a form from [employer] requesting work capacity.  We will set patient up for an FCE.  No work pending FCE review.  If work had daily route where he did not have to do heavy lifting and this would be something I think he could at this time.  Office follow-up post FCE.  He is off pain medication incision was good he is ambulatory.  Significant improved leg strength.  He still has a little bit of numbness directly over the patellar tendon.  Chief Complaint:  Chief Complaint  Patient presents with   Lower Back - Follow-up    02/22/2021 Right L3-4 microdiscectomy, Right L2 partial laminectomy   Visit Diagnoses:  1. S/P lumbar microdiscectomy     Plan: FCE also follow-up after FCE.  Follow-Up Instructions: No follow-ups on file.   Orders:  Orders Placed This Encounter  Procedures   Ambulatory referral to Physical Therapy   No orders of the defined types were placed in this encounter.   Imaging: No results found.  PMFS History: Patient Active Problem List   Diagnosis Date Noted   S/P lumbar microdiscectomy 02/22/2021   Lumbar herniated disc 01/13/2021   Past Medical History:  Diagnosis Date   Distal radius fracture, right    Enlarged prostate    Medical history non-contributory     Family History  Problem Relation Age of Onset   Prostate cancer Maternal Grandfather    Breast cancer Mother     Past Surgical History:  Procedure Laterality Date   CARPAL TUNNEL RELEASE Right 06/22/2016   Procedure: CARPAL TUNNEL RELEASE,right;  Surgeon: Dairl Ponder, MD;  Location: [facility];  Service:  Orthopedics;  Laterality: Right;   COLONOSCOPY     HERNIA REPAIR     as baby   LUMBAR LAMINECTOMY N/A 02/22/2021   Procedure: RIGHT L3-4 MICRODISCECTOMY,  Right L2 Partial Laminectomy.;  Surgeon: Eldred Manges, MD;  Location: [facility];  Service: Orthopedics;  Laterality: N/A;   OPEN REDUCTION INTERNAL FIXATION (ORIF) DISTAL RADIAL FRACTURE Right 06/22/2016   Procedure: OPEN REDUCTION INTERNAL FIXATION (ORIF) DISTAL RADIAL FRACTURE,right;  Surgeon: Dairl Ponder, MD;  Location: [facility];  Service: Orthopedics;  Laterality: Right;  Axillary block   Social History   Occupational History   Occupation: truck driver  Tobacco Use   Smoking status: Former    Packs/day: 0.50    Pack years: 0.00    Types: Cigarettes    Quit date: 12/22/2020    Years since quitting: 0.2   Smokeless tobacco: Never  Vaping Use   Vaping Use: Former  Substance and Sexual Activity   Alcohol use: No   Drug use: No   Sexual activity: Not on file

## 2021-04-12 ENCOUNTER — Telehealth: Payer: Self-pay | Admitting: Orthopaedic Surgery

## 2021-04-12 NOTE — Telephone Encounter (Signed)
Pt called stating he was supposed to have a SCE set up but he never received any calls. Pt would like a CB with the name and number of who is supposed to be doing this.   [phone]

## 2021-05-05 ENCOUNTER — Ambulatory Visit (INDEPENDENT_AMBULATORY_CARE_PROVIDER_SITE_OTHER): Payer: No Typology Code available for payment source | Admitting: Orthopaedic Surgery

## 2021-05-05 ENCOUNTER — Encounter: Payer: Self-pay | Admitting: Orthopaedic Surgery

## 2021-05-05 VITALS — BP 133/88 | HR 83 | Ht 67.5 in | Wt 200.0 lb

## 2021-05-05 DIAGNOSIS — Z9889 Other specified postprocedural states: Secondary | ICD-10-CM

## 2021-05-05 NOTE — Progress Notes (Addendum)
Post-Op Visit Note   Patient: Charles Delgado.           Date of Birth: [birth year]           MRN: [id number] Visit Date: 05/05/2021 PCP: [facility]   Assessment & Plan: 50 year old male returns post microdiscectomy on the right at L3-4 and right L2 partial laminectomy.  Patient had an FCE likely 2 weeks ago and unfortunately I do not have the report.  Was ordered by [employer]. were trying to get it faxed so I can review it.  Patient started therapy and has been in therapy for 2 weeks.  He notes he is getting improvement in his leg strength.  Lumbar incisions well-healed.  He states he still has some numbness over his anterior right thigh we discussed this is as expected.  He has noticed improving strength in his leg and has gotten good relief of his preop pain in his leg and back pain.  I have a form from [employer] asking about diagnosis current treatment for future treatment plan, anticipated MMI etc.  They may have done a good job of getting the FCE but they do not have the reports to me at this point I have nothing to review.  Work slip given for patient no work x2 weeks he will continue therapy x2 weeks and then return in 2 weeks I will review the FCE and make my recommendations for work resumption.  His incision looks good, he is moving and walking better.  He is off pain medication.  He will make a phone call to the [facility] insurance nurse Tilford Pillar RN and leave her a message that I have not received the FCE report.  Chief Complaint:  Chief Complaint  Patient presents with   Lower Back - Follow-up    02/22/2021 Right L3-4 microdiscectomy, Right L2 partial laminectomy   Visit Diagnoses:  1. S/P lumbar microdiscectomy     Plan: continue PT , ROV 2 wks . Work note not work times 2 wks  Follow-Up Instructions: Return in about 2 weeks (around 05/19/2021).   Orders:  No orders of the defined types were placed in this encounter.  No orders of the  defined types were placed in this encounter.   Imaging: No results found.  PMFS History: Patient Active Problem List   Diagnosis Date Noted   S/P lumbar microdiscectomy 02/22/2021   Lumbar herniated disc 01/13/2021   Past Medical History:  Diagnosis Date   Distal radius fracture, right    Enlarged prostate    Medical history non-contributory     Family History  Problem Relation Age of Onset   Prostate cancer Maternal Grandfather    Breast cancer Mother     Past Surgical History:  Procedure Laterality Date   CARPAL TUNNEL RELEASE Right 06/22/2016   Procedure: CARPAL TUNNEL RELEASE,right;  Surgeon: Dairl [facility]onder, MD;  Location: [facility];  Service: Orthopedics;  Laterality: Right;   COLONOSCOPY     HERNIA REPAIR     as baby   LUMBAR LAMINECTOMY N/A 02/22/2021   Procedure: RIGHT L3-4 MICRODISCECTOMY,  Right L2 Partial Laminectomy.;  Surgeon: Eldred Manges, MD;  [facility];  Service: Orthopedics;  Laterality: N/A;   OPEN REDUCTION INTERNAL FIXATION (ORIF) DISTAL RADIAL FRACTURE Right 06/22/2016   Procedure: OPEN REDUCTION INTERNAL FIXATION (ORIF) DISTAL RADIAL FRACTURE,right;  Surgeon: Dairl Ponder, MD;  Location: [facility];  Service: Orthopedics;  Laterality: Right;  Axillary block   Social History   Occupational History   Occupation: truck Hospital doctor  Tobacco Use   Smoking status: Former    Packs/day: 0.50    Pack years: 0.00    Types: Cigarettes    Quit date: 12/22/2020    Years since quitting: 0.3   Smokeless tobacco: Never  Vaping Use   Vaping Use: Former  Substance and Sexual Activity   Alcohol use: No   Drug use: No   Sexual activity: Not on file

## 2021-05-19 ENCOUNTER — Ambulatory Visit (INDEPENDENT_AMBULATORY_CARE_PROVIDER_SITE_OTHER): Payer: No Typology Code available for payment source | Admitting: Orthopaedic Surgery

## 2021-05-19 ENCOUNTER — Encounter: Payer: Self-pay | Admitting: Orthopaedic Surgery

## 2021-05-19 ENCOUNTER — Other Ambulatory Visit: Payer: Self-pay

## 2021-05-19 VITALS — BP 129/87 | HR 81 | Ht 67.5 in | Wt 200.0 lb

## 2021-05-19 DIAGNOSIS — Z9889 Other specified postprocedural states: Secondary | ICD-10-CM

## 2021-05-19 NOTE — Progress Notes (Addendum)
   Post-Op Visit Note   Patient: Charles Delgado.           Date of Birth: 11-23-1970           MRN: [id number] Visit Date: 05/19/2021 PCP: [facility]   Assessment & Plan: Postop right L3-4 microdiscectomy and right L2 partial laminectomy.  Patient had a migrated fragment and required more extensive bone removal to address the disc problem and retrieve the fragment that was causing compression.  He returns after FCE which finds him in the medium physical demand capacity.  Validity was greater than 80%.  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  I plan to recheck him in 8 weeks.  Once he maximizes work activity will sign him impairment rating and he will be at MMI.  Anticipated time for this is likely 2 months.  Recheck 7 weeks.  Chief Complaint:  Chief Complaint  Patient presents with   Lower Back - Follow-up    FCE review   Visit Diagnoses:  1. S/P lumbar microdiscectomy     Plan: Work resumption medium physical demand capacity.  Recheck 7 weeks.  Restrictions are in place for 8 weeks.  Follow-Up Instructions: Return in about 7 weeks (around 07/07/2021).   Orders:  No orders of the defined types were placed in this encounter.  No orders of the defined types were placed in this encounter.   Imaging: No results found.  PMFS History: Patient Active Problem List   Diagnosis Date Noted   S/P lumbar microdiscectomy 02/22/2021   Lumbar herniated disc 01/13/2021   Past Medical History:  Diagnosis Date   Distal radius fracture, right    Enlarged prostate    Medical history non-contributory     Family History  Problem Relation Age of Onset   Prostate cancer Maternal Grandfather    Breast cancer Mother     Past Surgical History:  Procedure Laterality Date   CARPAL TUNNEL RELEASE Right 06/22/2016   Procedure: CARPAL TUNNEL RELEASE,right;  Surgeon: Dairl Ponder, MD;  Location: [facility];  Service: Orthopedics;  Laterality: Right;   COLONOSCOPY     HERNIA REPAIR     as baby   LUMBAR LAMINECTOMY N/A 02/22/2021   Procedure: RIGHT L3-4 MICRODISCECTOMY,  Right L2 Partial Laminectomy.;  Surgeon: Eldred Manges, MD;  [facility];  Service: Orthopedics;  Laterality: N/A;   OPEN REDUCTION INTERNAL FIXATION (ORIF) DISTAL RADIAL FRACTURE Right 06/22/2016   Procedure: OPEN REDUCTION INTERNAL FIXATION (ORIF) DISTAL RADIAL FRACTURE,right;  Surgeon: Dairl Ponder, MD;  Location: [facility];  Service: Orthopedics;  Laterality: Right;  Axillary block   Social History   Occupational History   Occupation: truck driver  Tobacco Use   Smoking status: Former    Packs/day: 0.50    Types: Cigarettes    Quit date: 12/22/2020    Years since quitting: 0.4   Smokeless tobacco: Never  Vaping Use   Vaping Use: Former  Substance and Sexual Activity   Alcohol use: No   Drug use: No   Sexual activity: Not on file

## 2021-07-14 ENCOUNTER — Ambulatory Visit (INDEPENDENT_AMBULATORY_CARE_PROVIDER_SITE_OTHER): Payer: No Typology Code available for payment source | Admitting: Orthopaedic Surgery

## 2021-07-14 ENCOUNTER — Encounter: Payer: Self-pay | Admitting: Orthopaedic Surgery

## 2021-07-14 ENCOUNTER — Other Ambulatory Visit: Payer: Self-pay

## 2021-07-14 VITALS — BP 115/81 | Ht 68.0 in | Wt 203.0 lb

## 2021-07-14 DIAGNOSIS — Z9889 Other specified postprocedural states: Secondary | ICD-10-CM

## 2021-07-14 NOTE — Progress Notes (Signed)
Office Visit Note   Patient: Charles Delgado.           Date of Birth: [birth year]           MRN: [id number] Visit Date: 07/14/2021              Requested by: [facility] [address] [place],  [place] [address] PCP: [facility]   Assessment & Plan: Visit Diagnoses:  1. S/P lumbar microdiscectomy     Plan: Patient's impairment is 5% of the back in line with the [facility] Compensation act guidelines for single level microdiscectomy done for disc herniation.  He is at MMI.  Work recommendations are exactly as previously stated on his 05/19/2021 office visit which is consistent with findings of the FCE which was found to be in the medium demand capacity.  He can return to see me on an as-needed basis and is happy with the surgical result.  Follow-Up Instructions: No follow-ups on file.   Orders:  No orders of the defined types were placed in this encounter.  No orders of the defined types were placed in this encounter.     Procedures: No procedures performed   Clinical Data: No additional findings.   Subjective: Chief Complaint  Patient presents with   Lower Back - Follow-up    02/22/2021 Right L3-4 microdiscectomy, right L2 partial laminectomy    HPI 50 year old male returns he had L3-4 microdiscectomy with moderate graded fragment.  Fragment was removed decompressed.  He has had an FCE and was found to be in medium physical demand capacity.  His previous job involves more significant lifting.  This was found to be a valid FCE.  I given him a work slip for work resumption in line with findings of the [employer].  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  He states he has not heard back about this.  I received something from the [employer]. carrier fax dated 07/12/2021 asking if he is going to need an FCE.  My 05/19/2021 note plainly  outlines diagnosis outlined plan, recommendations for work and expected time for MMI.  Patient returns today and can be assigned an impairment rating as previously planned.  Review of Systems updated unchanged.   Objective: Vital Signs: BP 115/81   Ht 5\' 8"  (1.727 m)   Wt 203 lb (92.1 kg)   BMI 30.87 kg/m   Physical Exam Constitutional:      Appearance: He is well-developed.  HENT:     Head: Normocephalic and atraumatic.     Right Ear: External ear normal.     Left Ear: External ear normal.  Eyes:     Pupils: Pupils are equal, round, and reactive to light.  Neck:     Thyroid: No thyromegaly.     Trachea: No tracheal deviation.  Cardiovascular:     Rate and Rhythm: Normal rate.  Pulmonary:     Effort: Pulmonary effort is normal.     Breath sounds: No wheezing.  Abdominal:     General: Bowel sounds are normal.     Palpations: Abdomen is soft.  Musculoskeletal:     Cervical back: Neck supple.  Skin:    General: Skin is warm and dry.     Capillary Refill: Capillary refill takes less than 2 seconds.  Neurological:     Mental  Status: He is alert and oriented to person, place, and time.  Psychiatric:        Behavior: Behavior normal.        Thought Content: Thought content normal.        Judgment: Judgment normal.    Ortho Exam patient is amatory get from sitting standing incisions well-healed.  No isolated motor weakness.  Specialty Comments:  No specialty comments available.  Imaging: No results found.   PMFS History: Patient Active Problem List   Diagnosis Date Noted   S/P lumbar microdiscectomy 02/22/2021   Lumbar herniated disc 01/13/2021   Past Medical History:  Diagnosis Date   Distal radius fracture, right    Enlarged prostate    Medical history non-contributory     Family History  Problem Relation Age of Onset   Prostate cancer Maternal Grandfather    Breast cancer Mother     Past Surgical History:  Procedure Laterality Date   CARPAL TUNNEL  RELEASE Right 06/22/2016   Procedure: CARPAL TUNNEL RELEASE,right;  Surgeon: Dairl Ponder, MD;  Location: [facility];  Service: Orthopedics;  Laterality: Right;   COLONOSCOPY     HERNIA REPAIR     as baby   LUMBAR LAMINECTOMY N/A 02/22/2021   Procedure: RIGHT L3-4 MICRODISCECTOMY,  Right L2 Partial Laminectomy.;  Surgeon: Eldred Manges, MD;  [facility];  Service: Orthopedics;  Laterality: N/A;   OPEN REDUCTION INTERNAL FIXATION (ORIF) DISTAL RADIAL FRACTURE Right 06/22/2016   Procedure: OPEN REDUCTION INTERNAL FIXATION (ORIF) DISTAL RADIAL FRACTURE,right;  Surgeon: Dairl Ponder, MD;  Location: [facility];  Service: Orthopedics;  Laterality: Right;  Axillary block   Social History   Occupational History   Occupation: truck driver  Tobacco Use   Smoking status: Former    Packs/day: 0.50    Types: Cigarettes    Quit date: 12/22/2020    Years since quitting: 0.5   Smokeless tobacco: Never  Vaping Use   Vaping Use: Former  Substance and Sexual Activity   Alcohol use: No   Drug use: No   Sexual activity: Not on file

## 2021-07-26 ENCOUNTER — Telehealth: Payer: Self-pay | Admitting: Orthopaedic Surgery

## 2021-07-26 NOTE — Telephone Encounter (Signed)
Pt called and needs a form stating that he has  been released for WC. Email please [email or web address]  CB [phone]

## 2021-07-27 NOTE — Telephone Encounter (Signed)
FYI

## 2021-07-27 NOTE — Telephone Encounter (Signed)
Patient and work comp are requesting note in regards to restrictions listed in FCE and whether these are permanent restrictions or not. According to Work Comp, patient is wanting to return to work.  OK for note to patient listing his restrictions per FCE? Is this in permanent capacity? Please advise.

## 2021-07-27 NOTE — Telephone Encounter (Signed)
Could you please find out what Work Comp is needing so that I can get it to the patient?

## 2021-11-09 ENCOUNTER — Other Ambulatory Visit: Payer: Self-pay

## 2021-11-09 ENCOUNTER — Ambulatory Visit (INDEPENDENT_AMBULATORY_CARE_PROVIDER_SITE_OTHER): Payer: No Typology Code available for payment source | Admitting: Orthopaedic Surgery

## 2021-11-09 ENCOUNTER — Ambulatory Visit (INDEPENDENT_AMBULATORY_CARE_PROVIDER_SITE_OTHER): Payer: No Typology Code available for payment source

## 2021-11-09 ENCOUNTER — Encounter: Payer: Self-pay | Admitting: Orthopaedic Surgery

## 2021-11-09 VITALS — BP 128/93 | HR 116 | Ht 68.0 in | Wt 191.0 lb

## 2021-11-09 DIAGNOSIS — Z9889 Other specified postprocedural states: Secondary | ICD-10-CM

## 2021-11-09 NOTE — Progress Notes (Signed)
Office Visit Note   Patient: Charles Delgado.           Date of Birth: 10-14-1971           MRN: [id number] Visit Date: 11/09/2021              [facility]ed by: [facility] [address] [place],  [place] [address] PCP: [facility]   Assessment & Plan: Visit Diagnoses:  1. S/P lumbar microdiscectomy     Plan: Would reck would recommend proceeding with an MRI lumbar.  Previous scan was done at [facility].  Would recommend lumbar MRI with and without contrast to rule out recurrent disc herniation with his persistent symptoms.  Would be glad to see him post new imaging studies.  No work note given today since had previously assigned an impairment rating we can readdress this based on imaging studies.  Follow-Up Instructions: No follow-ups on file.   Orders:  Orders Placed This Encounter  Procedures   XR Lumbar Spine 2-3 Views   No orders of the defined types were placed in this encounter.     Procedures: No procedures performed   Clinical Data: No additional findings.   Subjective: Chief Complaint  Patient presents with   Lower Back - Pain    HPI 51 year old male returns from not seen him since September when I signed the impairment rating for his right L3-4 microdiscectomy with migrated fragment.  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  He was given some prednisone and developed a knot on his left breast had to have a mammogram which was negative and they thought it might be steroid related.  It has decreased in size since then.  He had a IM injection of Toradol in November which gave him some relief.  He feels better in supine position.  He states prolonged sitting but it still bothers him primarily around his right knee and medial aspect of his knee.  Occasionally has some pain in his groin.  Review of Systems all other systems noncontributory  to HPI.   Objective: Vital Signs: BP (!) 128/93    Pulse (!) 116    Ht 5\' 8"  (1.727 m)    Wt 191 lb (86.6 kg)    BMI 29.04 kg/m   Physical Exam Constitutional:      Appearance: He is well-developed.  HENT:     Head: Normocephalic and atraumatic.     Right Ear: External ear normal.     Left Ear: External ear normal.  Eyes:     Pupils: Pupils are equal, round, and reactive to light.  Neck:     Thyroid: No thyromegaly.     Trachea: No tracheal deviation.  Cardiovascular:     Rate and Rhythm: Normal rate.  Pulmonary:     Effort: Pulmonary effort is normal.     Breath sounds: No wheezing.  Abdominal:     General: Bowel sounds are normal.     Palpations: Abdomen is soft.  Musculoskeletal:     Cervical back: Neck supple.  Skin:    General: Skin is warm and dry.     Capillary Refill: Capillary refill takes less than 2 seconds.  Neurological:     Mental Status: He is alert and oriented to person, place, and time.  Psychiatric:        Behavior:  Behavior normal.        Thought Content: Thought content normal.        Judgment: Judgment normal.    Ortho Exam negative logroll hips patient can step up and step down on a step without problems.  Reflexes are symmetrical negative logroll the hips.  No atrophy of the quads.  Lumbar incisions well-healed.  Specialty Comments:  No specialty comments available.  Imaging: No results found.   PMFS History: Patient Active Problem List   Diagnosis Date Noted   S/P lumbar microdiscectomy 02/22/2021   Lumbar herniated disc 01/13/2021   Past Medical History:  Diagnosis Date   Distal radius fracture, right    Enlarged prostate    Medical history non-contributory     Family History  Problem Relation Age of Onset   Prostate cancer Maternal Grandfather    Breast cancer Mother     Past Surgical History:  Procedure Laterality Date   CARPAL TUNNEL RELEASE Right 06/22/2016   Procedure: CARPAL TUNNEL RELEASE,right;  Surgeon: Dairl Ponder, MD;  Location: [facility];  Service: Orthopedics;  Laterality: Right;   COLONOSCOPY     HERNIA REPAIR     as baby   LUMBAR LAMINECTOMY N/A 02/22/2021   Procedure: RIGHT L3-4 MICRODISCECTOMY,  Right L2 Partial Laminectomy.;  Surgeon: Eldred Manges, MD;  [facility];  Service: Orthopedics;  Laterality: N/A;   OPEN REDUCTION INTERNAL FIXATION (ORIF) DISTAL RADIAL FRACTURE Right 06/22/2016   Procedure: OPEN REDUCTION INTERNAL FIXATION (ORIF) DISTAL RADIAL FRACTURE,right;  Surgeon: Dairl Ponder, MD;  Location: [facility];  Service: Orthopedics;  Laterality: Right;  Axillary block   Social History   Occupational History   Occupation: truck driver  Tobacco Use   Smoking status: Former    Packs/day: 0.50    Types: Cigarettes    Quit date: 12/22/2020    Years since quitting: 0.8   Smokeless tobacco: Never  Vaping Use   Vaping Use: Former  Substance and Sexual Activity   Alcohol use: No   Drug use: No   Sexual activity: Not on file

## 2021-11-09 NOTE — Progress Notes (Signed)
Office Visit Note   Patient: Charles Delgado.           Date of Birth: 06/06/1971           MRN: [id number] Visit Date: 11/09/2021              Requested by: [facility] [address] [place],  [place] [address] PCP: [facility]   Assessment & Plan: Visit Diagnoses:  1. S/P lumbar microdiscectomy     Plan: Would recommend proceeding with lumbar MRI scan with and without contrast.  His previous imaging study was done at [facility].  Scan is to rule out recurrent disc herniation with his persistent symptoms.  I did not give him a work note today and I have previously assigned impairment rating.  Would be glad to see him back after the imaging study.  Follow-Up Instructions: No follow-ups on file.   Orders:  No orders of the defined types were placed in this encounter.  No orders of the defined types were placed in this encounter.     Procedures: No procedures performed   Clinical Data: No additional findings.   Subjective: Chief Complaint  Patient presents with   Lower Back - Pain    HPI 51 year old male returns states he has continued to have problems with his back and he has not signed to close his Worker's Comp. case.  He had surgery by me on 02/22/2021 on the right for an L3-4 extruded fragment cephalad migrated with microdiscectomy.  This patient states he is continue to have numbness around his right knee.  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  He had a Toradol injection in November gave him some relief this was intramuscular injection.  He was placed on some prednisone by urgent care and he states he later developed a knot on his left breast had to have a mammogram and he states a biopsy was not needed and the mass is gone down some but is still present.  Patient  denies any associated bowel or bladder symptoms no chills or fever.  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  Review of Systems no chills or fever no drainage from the incision.  All other systems noncontributory to HPI.   Objective: Vital Signs: BP (!) 128/93    Pulse (!) 116    Ht 5\' 8"  (1.727 m)    Wt 191 lb (86.6 kg)    BMI 29.04 kg/m   Physical Exam Constitutional:      Appearance: He is well-developed.  HENT:     Head: Normocephalic and atraumatic.     Right Ear: External ear normal.     Left Ear: External ear normal.  Eyes:     Pupils: Pupils are equal, round, and reactive to light.  Neck:     Thyroid: No thyromegaly.     Trachea: No tracheal deviation.  Cardiovascular:     Rate and Rhythm: Normal rate.  Pulmonary:     Effort: Pulmonary effort is normal.     Breath sounds: No wheezing.  Abdominal:     General: Bowel sounds are normal.     Palpations: Abdomen is soft.  Musculoskeletal:     Cervical back: Neck supple.  Skin:  General: Skin is warm and dry.     Capillary Refill: Capillary refill takes less than 2 seconds.  Neurological:     Mental Status: He is alert and oriented to person, place, and time.  Psychiatric:        Behavior: Behavior normal.        Thought Content: Thought content normal.        Judgment: Judgment normal.    Ortho Exam lumbar incisions well-healed.  Negative FABER test negative logroll the hips.  He can step up and down steps without problem either leg.  Good quad strength right and left.  No rash over exposed skin.  Specialty Comments:  No specialty comments available.  Imaging: No results found.   PMFS History: Patient Active Problem List   Diagnosis Date Noted   S/P lumbar microdiscectomy 02/22/2021   Lumbar herniated disc 01/13/2021   Past Medical History:  Diagnosis Date   Distal radius fracture, right    Enlarged prostate    Medical history non-contributory     Family History  Problem Relation  Age of Onset   Prostate cancer Maternal Grandfather    Breast cancer Mother     Past Surgical History:  Procedure Laterality Date   CARPAL TUNNEL RELEASE Right 06/22/2016   Procedure: CARPAL TUNNEL RELEASE,right;  Surgeon: Dairl Ponder, MD;  Location: [facility];  Service: Orthopedics;  Laterality: Right;   COLONOSCOPY     HERNIA REPAIR     as baby   LUMBAR LAMINECTOMY N/A 02/22/2021   Procedure: RIGHT L3-4 MICRODISCECTOMY,  Right L2 Partial Laminectomy.;  Surgeon: Eldred Manges, MD;  [facility];  Service: Orthopedics;  Laterality: N/A;   OPEN REDUCTION INTERNAL FIXATION (ORIF) DISTAL RADIAL FRACTURE Right 06/22/2016   Procedure: OPEN REDUCTION INTERNAL FIXATION (ORIF) DISTAL RADIAL FRACTURE,right;  Surgeon: Dairl Ponder, MD;  Location: [facility];  Service: Orthopedics;  Laterality: Right;  Axillary block   Social History   Occupational History   Occupation: truck driver  Tobacco Use   Smoking status: Former    Packs/day: 0.50    Types: Cigarettes    Quit date: 12/22/2020    Years since quitting: 0.8   Smokeless tobacco: Never  Vaping Use   Vaping Use: Former  Substance and Sexual Activity   Alcohol use: No   Drug use: No   Sexual activity: Not on file

## 2021-12-17 ENCOUNTER — Encounter: Payer: Self-pay | Admitting: Orthopaedic Surgery

## 2021-12-17 ENCOUNTER — Ambulatory Visit (INDEPENDENT_AMBULATORY_CARE_PROVIDER_SITE_OTHER): Payer: No Typology Code available for payment source | Admitting: Orthopaedic Surgery

## 2021-12-17 ENCOUNTER — Other Ambulatory Visit: Payer: Self-pay

## 2021-12-17 VITALS — BP 135/94 | HR 81 | Ht 68.0 in | Wt 191.0 lb

## 2021-12-17 DIAGNOSIS — M5126 Other intervertebral disc displacement, lumbar region: Secondary | ICD-10-CM | POA: Diagnosis not present

## 2021-12-17 DIAGNOSIS — Z9889 Other specified postprocedural states: Secondary | ICD-10-CM

## 2021-12-20 NOTE — Progress Notes (Signed)
Office Visit Note   Patient: Charles Delgado.           Date of Birth: February 12, 1971           MRN: [id number] Visit Date: 12/17/2021              Requested by: [facility] [address] [place],  [place] [address] PCP: [facility]   Assessment & Plan: Visit Diagnoses:  1. Lumbar herniated disc   2. S/P lumbar microdiscectomy     Plan: Reviewed images and report with patient.  We will set up for single epidural for his continued symptoms.  If he gets significant improvement we can consider work resumption if he continues to have symptoms options would favor FCE based on his current symptoms rather than more surgery.  Work slip given no work pending follow-up visit after epidural.  Lumbar epidural for recurrent disc protrusion L3-4 and new left L4-5.  Follow-Up Instructions: No follow-ups on file.   Orders:  Orders Placed This Encounter  Procedures   Ambulatory referral to Physical Medicine Rehab   No orders of the defined types were placed in this encounter.     Procedures: No procedures performed   Clinical Data: No additional findings.   Subjective: Chief Complaint  Patient presents with   Lower Back - Pain, Follow-up    MRI review    HPI 51 year old male returns post microdiscectomy right L3-4 with right laminotomy.  Patient returns post Imaging studies none done at [facility].  This shows moderate disc bulge at L3-4 moderate in size which some extension into the left subarticular zone rightward to the central and right subarticular zone and into the right foraminal zone and medial right extraforaminal zone.  Moderate severe right subarticular zone stenosis moderate central zone stenosis.  At L4-5 some cephalad migration upon the L4 vertebrae.  This is in the left subarticular zone.  Mild disc degeneration at L5 1 unchanged.  Patient continues to have some numbness near his right knee and distal to the patella anterior knee.  He  occasionally has some low back pain sometimes pain shoots to his groin.  He noticed some numbness temporarily in that region no bowel or bladder associated symptoms.  Surgery date for his back was 02/22/2021.  Patient is a Administrator.  Review of Systems updated unchanged.   Objective: Vital Signs: BP (!) 135/94    Pulse 81    Ht 5\' 8"  (1.727 m)    Wt 191 lb (86.6 kg)    BMI 29.04 kg/m   Physical Exam Constitutional:      Appearance: He is well-developed.  HENT:     Head: Normocephalic and atraumatic.     Right Ear: External ear normal.     Left Ear: External ear normal.  Eyes:     Pupils: Pupils are equal, round, and reactive to light.  Neck:     Thyroid: No thyromegaly.     Trachea: No tracheal deviation.  Cardiovascular:     Rate and Rhythm: Normal rate.  Pulmonary:     Effort: Pulmonary effort is normal.     Breath sounds: No wheezing.  Abdominal:     General: Bowel sounds are normal.     Palpations: Abdomen is soft.  Musculoskeletal:     Cervical back: Neck supple.  Skin:    General: Skin is warm and dry.     Capillary Refill: Capillary refill takes less than 2  seconds.  Neurological:     Mental Status: He is alert and oriented to person, place, and time.  Psychiatric:        Behavior: Behavior normal.        Thought Content: Thought content normal.        Judgment: Judgment normal.    Ortho Exam patient has good quad strength good hip flexor strength.  Some decreased sensation over the right anterior distal thigh.  Abductors are strong EHL anterior tib is strong.  Gets from sitting to standing he can heel and toe walk.  Specialty Comments:  No specialty comments available.  Imaging: Impression  IMPRESSION:   L3-4: Prior right laminotomy or laminectomy. Moderate DDD, including a moderate circumferential disc bulge and a moderate-sized, broad-based posterolateral disc herniation (age-indeterminate) that extends from the left subarticular zone rightward through  the central and right subarticular zones and into the right foraminal zone and medial right extraforaminal zone. Mild bilateral facet arthropathy. Moderately severe right subarticular zone stenosis. Moderate central zone and left subarticular zone stenosis. Moderately severe right neural foraminal stenosis with impingement on the exiting right L3 nerve root. Mild left neural foraminal stenosis. Expected enhancing extradural scar tissue on the right.   L4-5: Mild DDD. Small central zone and left subarticular zone disc extrusion (age-indeterminate) with caudal migration approaching mid way up behind the L4 vertebral body, in the left subarticular zone. Minimal bilateral facet arthrosis. Moderate left subarticular zone stenosis with at least mild impingement of the descending left L5 nerve root. Moderate left neural foraminal stenosis. Mild central zone, right subarticular zone, and right neural foraminal stenosis.   L5-S1: Mild DDD. Small right subarticular zone disc extrusion (age-indeterminate) with mild caudal migration. Mild bilateral facet arthropathy. Mild right subarticular zone stenosis with abutment and slight posterior displacement of the descending right S1 nerve root. No significant central zone or left subarticular zone stenosis. Mild-to-moderate bilateral neural foraminal stenosis.   Electronically Signed by: [place] Holder on 11/29/2021 10:06 AM Narrative  INDICATION: Other specified postprocedural states. Lower back pain.   TECHNIQUE: Multiplanar, multisequence MR imaging of the lumbar spine before and after IV contrast administration.   CONTRAST: Gadavist 8 mL.   COMPARISON: None available.   FINDINGS: For the purposes of this dictation, it is assumed that the most caudal fully-segmented lumbar-type vertebra is L5, and that the most caudal fully-developed intervertebral disc is L5-S1.   Prior right laminotomy or laminectomy at L3-4 (no preoperative imaging available for comparison).    Degenerative disc disease (DDD) and facet arthropathy (see below).   Vertebral alignment: Straightening of the normal lumbar lordosis. No significant subluxation.   Marrow signal: Mild mixed type I and type II discogenic marrow signal changes posteriorly at L3-L4.   Vertebral body heights: Normal.   Conus medullaris: Terminates at a normal level (mid L2). Normal in size, contour, and signal intensity.   Individual disc levels:   T12-L1 (sagittal images only): Normal.  L1-2 (sagittal images only): Normal.  L2-3: Normal.   L3-4: Prior right laminotomy or laminectomy. Moderate DDD, including a moderate circumferential disc bulge and a moderate-sized, broad-based posterolateral disc herniation (age-indeterminate) that extends from the left subarticular zone rightward through the central and right subarticular zones and into the right foraminal zone and medial right extraforaminal zone. Mild bilateral facet arthropathy. Moderately severe right subarticular zone stenosis. Moderate central zone and left subarticular zone stenosis. Moderately severe right neural foraminal stenosis with impingement on the exiting right L3 nerve root. Mild left neural foraminal stenosis.  Expected enhancing extradural scar tissue on the right.   L4-5: Mild DDD. Small central zone and left subarticular zone disc extrusion (age-indeterminate) with caudal migration approaching mid way up behind the L4 vertebral body, in the left subarticular zone. Minimal bilateral facet arthrosis. Moderate left subarticular zone stenosis with at least mild impingement of the descending left L5 nerve root. Moderate left neural foraminal stenosis. Mild central zone, right subarticular zone, and right neural foraminal stenosis.   L5-S1: Mild DDD. Small right subarticular zone disc extrusion (age-indeterminate) with mild caudal migration. Mild bilateral facet arthropathy. Mild right subarticular zone stenosis with abutment and slight  posterior displacement of the descending right S1 nerve root. No significant central zone or left subarticular zone stenosis. Mild-to-moderate bilateral neural foraminal stenosis.   Enhancement: No abnormal leptomeningeal enhancement. Procedure Note  Holder, Mali A, MD - 11/29/2021    PMFS History: Patient Active Problem List   Diagnosis Date Noted   S/P lumbar microdiscectomy 02/22/2021   Lumbar herniated disc 01/13/2021   Past Medical History:  Diagnosis Date   Distal radius fracture, right    Enlarged prostate    Medical history non-contributory     Family History  Problem Relation Age of Onset   Prostate cancer Maternal Grandfather    Breast cancer Mother     Past Surgical History:  Procedure Laterality Date   CARPAL TUNNEL RELEASE Right 06/22/2016   Procedure: CARPAL TUNNEL RELEASE,right;  Surgeon: Charlotte Crumb, MD;  Location: [facility] [facility];  Service: Orthopedics;  Laterality: Right;   COLONOSCOPY     HERNIA REPAIR     as baby   LUMBAR LAMINECTOMY N/A 02/22/2021   Procedure: RIGHT L3-4 MICRODISCECTOMY,  Right L2 Partial Laminectomy.;  Surgeon: Marybelle Killings, MD;  Location: [place];  Service: Orthopedics;  Laterality: N/A;   OPEN REDUCTION INTERNAL FIXATION (ORIF) DISTAL RADIAL FRACTURE Right 06/22/2016   Procedure: OPEN REDUCTION INTERNAL FIXATION (ORIF) DISTAL RADIAL FRACTURE,right;  Surgeon: Charlotte Crumb, MD;  Location: [place];  Service: Orthopedics;  Laterality: Right;  Axillary block   Social History   Occupational History   Occupation: truck driver  Tobacco Use   Smoking status: Former    Packs/day: 0.50    Types: Cigarettes    Quit date: 12/22/2020    Years since quitting: 0.9   Smokeless tobacco: Never  Vaping Use   Vaping Use: Former  Substance and Sexual Activity   Alcohol use: No   Drug use: No   Sexual activity: Not on file

## 2022-02-15 ENCOUNTER — Ambulatory Visit (INDEPENDENT_AMBULATORY_CARE_PROVIDER_SITE_OTHER): Payer: No Typology Code available for payment source | Admitting: Physical Medicine and Rehabilitation

## 2022-02-15 ENCOUNTER — Encounter: Payer: Self-pay | Admitting: Physical Medicine and Rehabilitation

## 2022-02-15 ENCOUNTER — Ambulatory Visit: Payer: Self-pay

## 2022-02-15 VITALS — BP 107/73 | HR 80

## 2022-02-15 DIAGNOSIS — M5416 Radiculopathy, lumbar region: Secondary | ICD-10-CM

## 2022-02-15 MED ORDER — METHYLPREDNISOLONE ACETATE 80 MG/ML IJ SUSP
80.0000 mg | Freq: Once | INTRAMUSCULAR | Status: AC
  Administered 2022-02-15: 80 mg

## 2022-02-15 NOTE — Progress Notes (Signed)
Pt state lower back pain that travels both groin area. Pt state walking, standing and bending makes the pain worse. Pt state he takes over the counter pain meds and lay down to rest to help ease his pain. ? ?Numeric Pain Rating Scale and Functional Assessment ?Average Pain 4 ? ? ?In the last MONTH (on 0-10 scale) has pain interfered with the following? ? ?1. General activity like being  able to carry out your everyday physical activities such as walking, climbing stairs, carrying groceries, or moving a chair?  ?Rating(7) ? ? ?+Driver, -BT, -Dye Allergies. ? ?

## 2022-02-15 NOTE — Patient Instructions (Signed)

## 2022-02-17 NOTE — Progress Notes (Signed)
? ?Charles RinneWalter R Valliant Jr. - 51 y.o. male MRN [id number]  Date of birth: 01/15/1971 ? ?Office Visit Note: ?Visit Date: 02/15/2022 ?PCP: [facility] ?Referred by: [facility] ? ?Subjective: ?Chief Complaint  ?Patient presents with  ? Lower Back - Pain  ? ?HPI:  Charles RinneWalter R Ebrahim Jr. is a 51 y.o. male who comes in today at the request of Dr. Annell GreeningMark Yates for planned Left L4 Lumbar Transforaminal epidural steroid injection with fluoroscopic guidance.  The patient has failed conservative care including home exercise, medications, time and activity modification.  This injection will be diagnostic and hopefully therapeutic.  Please see requesting physician notes for further details and justification.  Original referral from February for L3-4 disc herniation and new L4--5 herniation.  Patient with predominant pain on the left with referral into the thigh more of an L3 and L4 distribution.  Some right-sided complaints but mainly left.  Reviewed new MRI report status post laminectomy microdiscectomy.  We will complete left L4 transforaminal injection today.  Please note that post procedure patient did have a vasovagal response with feeling nauseated and brief vomiting.  He recovered well and we did monitor him with his blood pressure returning to normal and he did fine.  I did have a long discussion with him about vasovagal responses with injections and medical procedures. ? ?ROS Otherwise per HPI. ? ?Assessment & Plan: ?Visit Diagnoses:  ?  ICD-10-CM   ?1. Lumbar radiculopathy  M54.16 XR C-ARM NO REPORT  ?  Epidural Steroid injection  ?  methylPREDNISolone acetate (DEPO-MEDROL) injection 80 mg  ?  ?  ?Plan: No additional findings.  ? ?Meds & Orders:  ?Meds ordered this encounter  ?Medications  ? methylPREDNISolone acetate (DEPO-MEDROL) injection 80 mg  ?  ?Orders Placed This Encounter  ?Procedures  ? XR C-ARM NO REPORT  ? Epidural Steroid injection  ?  ?Follow-up: Return in about 2 weeks (around  03/01/2022) for Annell GreeningMark Yates, MD.  ? ?Procedures: ?No procedures performed  ?Lumbosacral Transforaminal Epidural Steroid Injection - Sub-Pedicular Approach with Fluoroscopic Guidance ? ?Patient: Charles RinneWalter R Mccree Jr.      ?Date of Birth: 01/06/1971 ?MRN: [id number] ?PCP: [facility]      ?Visit Date: 02/15/2022 ?  ?Universal Protocol:    ?Date/Time: 02/15/2022 ? ?Consent Given By: the patient ? ?Position: PRONE ? ?Additional Comments: ?Vital signs were monitored before and after the procedure. ?Patient was prepped and draped in the usual sterile fashion. ?The correct patient, procedure, and site was verified. ? ? ?Injection Procedure Details:  ? ?Procedure diagnoses: Lumbar radiculopathy [M54.16]   ? ?Meds Administered:  ?Meds ordered this encounter  ?Medications  ? methylPREDNISolone acetate (DEPO-MEDROL) injection 80 mg  ? ? ?Laterality: Left ? ?Location/Site: L4 ? ?Needle:5.0 in., 22 ga.  Short bevel or Quincke spinal needle ? ?Needle Placement: Transforaminal ? ?Findings: ?  ? -Comments: Excellent flow of contrast along the nerve, nerve root and into the epidural space. ? ?Procedure Details: ?After squaring off the end-plates to get a true AP view, the C-arm was positioned so that an oblique view of the foramen as noted above was visualized. The target area is just inferior to the "nose of the scotty dog" or sub pedicular. The soft tissues overlying this structure were infiltrated with 2-3 ml. of 1% Lidocaine without Epinephrine. ? ?The spinal needle was inserted toward the target using a "trajectory" view along the fluoroscope beam.  Under AP and lateral visualization, the needle  was advanced so it did not puncture dura and was located close the 6 O'Clock position of the pedical in AP tracterory. Biplanar projections were used to confirm position. Aspiration was confirmed to be negative for CSF and/or blood. A 1-2 ml. volume of Isovue-250 was injected and flow of contrast was noted at each level.  Radiographs were obtained for documentation purposes.  ? ?After attaining the desired flow of contrast documented above, a 0.5 to 1.0 ml test dose of 0.25% Marcaine was injected into each respective transforaminal space.  The patient was observed for 90 seconds post injection.  After no sensory deficits were reported, and normal lower extremity motor function was noted,   the above injectate was administered so that equal amounts of the injectate were placed at each foramen (level) into the transforaminal epidural space. ? ? ?Additional Comments:  ?Patient tolerated the procedure very well.  At the end of the procedure when done he did start to have some vasovagal type symptoms where he was hot and sweaty.  Subsequently he felt really nauseated and did actually vomit at that point.  He recovered well low and the recovery area and was discharged without any issues.  I did have a long talk with him about vasovagal type issues.  In the future with any injections would suggest preprocedure Valium. ?Dressing: 2 x 2 sterile gauze and Band-Aid ?  ? ?Post-procedure details: ?Patient was observed during the procedure. ?Post-procedure instructions were reviewed. ? ?Patient left the clinic in stable condition. ?  ? ?Clinical History: ?Lumbar Spine MRI ? ?FINDINGS:  ? ?Prior right laminotomy or laminectomy at L3-4 (no preoperative imaging available for comparison).  ? ?Degenerative disc disease (DDD) and facet arthropathy (see below).  ? ?Vertebral alignment: Straightening of the normal lumbar lordosis. No significant subluxation.  ? ?Marrow signal: Mild mixed type I and type II discogenic marrow signal changes posteriorly at L3-L4.  ? ?Vertebral body heights: Normal.  ? ?Conus medullaris: Terminates at a normal level (mid L2). Normal in size, contour, and signal intensity.  ? ?Individual disc levels:  ? ?T12-L1 (sagittal images only): Normal.  ?L1-2 (sagittal images only): Normal.  ?L2-3: Normal.  ? ?L3-4: Prior right  laminotomy or laminectomy. Moderate DDD, including a moderate circumferential disc bulge and a moderate-sized, broad-based posterolateral disc herniation (age-indeterminate) that extends from the left subarticular zone rightward through the central and right subarticular zones and into the right foraminal zone and medial right extraforaminal zone. Mild bilateral facet arthropathy. Moderately severe right subarticular zone stenosis. Moderate central zone and left subarticular zone stenosis. Moderately severe right neural foraminal stenosis with impingement on the exiting right L3 nerve root. Mild left neural foraminal stenosis. Expected enhancing extradural scar tissue on the right.  ? ?L4-5: Mild DDD. Small central zone and left subarticular zone disc extrusion (age-indeterminate) with caudal migration approaching mid way up behind the L4 vertebral body, in the left subarticular zone. Minimal bilateral facet arthrosis. Moderate left subarticular zone stenosis with at least mild impingement of the descending left L5 nerve root. Moderate left neural foraminal stenosis. Mild central zone, right subarticular zone, and right neural foraminal stenosis.  ? ?L5-S1: Mild DDD. Small right subarticular zone disc extrusion (age-indeterminate) with mild caudal migration. Mild bilateral facet arthropathy. Mild right subarticular zone stenosis with abutment and slight posterior displacement of the descending right S1 nerve root. No significant central zone or left subarticular zone stenosis. Mild-to-moderate bilateral neural foraminal stenosis.  ? ?Enhancement: No abnormal leptomeningeal enhancement.  ? ? ?IMPRESSION:  ? ?  L3-4: Prior right laminotomy or laminectomy. Moderate DDD, including a moderate circumferential disc bulge and a moderate-sized, broad-based posterolateral disc herniation (age-indeterminate) that extends from the left subarticular zone rightward through the central and right subarticular zones and into the right  foraminal zone and medial right extraforaminal zone. Mild bilateral facet arthropathy. Moderately severe right subarticular zone stenosis. Moderate central zone and left subarticular zone stenosis. Moderately severe r

## 2022-02-17 NOTE — Procedures (Signed)
Lumbosacral Transforaminal Epidural Steroid Injection - Sub-Pedicular Approach with Fluoroscopic Guidance ? ?Patient: Charles Delgado.      ?Date of Birth: Apr 13, 1971 ?MRN: [id number] ?PCP: [facility]      ?Visit Date: 02/15/2022 ?  ?Universal Protocol:    ?Date/Time: 02/15/2022 ? ?Consent Given By: the patient ? ?Position: PRONE ? ?Additional Comments: ?Vital signs were monitored before and after the procedure. ?Patient was prepped and draped in the usual sterile fashion. ?The correct patient, procedure, and site was verified. ? ? ?Injection Procedure Details:  ? ?Procedure diagnoses: Lumbar radiculopathy [M54.16]   ? ?Meds Administered:  ?Meds ordered this encounter  ?Medications  ? methylPREDNISolone acetate (DEPO-MEDROL) injection 80 mg  ? ? ?Laterality: Left ? ?Location/Site: L4 ? ?Needle:5.0 in., 22 ga.  Short bevel or Quincke spinal needle ? ?Needle Placement: Transforaminal ? ?Findings: ?  ? -Comments: Excellent flow of contrast along the nerve, nerve root and into the epidural space. ? ?Procedure Details: ?After squaring off the end-plates to get a true AP view, the C-arm was positioned so that an oblique view of the foramen as noted above was visualized. The target area is just inferior to the "nose of the scotty dog" or sub pedicular. The soft tissues overlying this structure were infiltrated with 2-3 ml. of 1% Lidocaine without Epinephrine. ? ?The spinal needle was inserted toward the target using a "trajectory" view along the fluoroscope beam.  Under AP and lateral visualization, the needle was advanced so it did not puncture dura and was located close the 6 O'Clock position of the pedical in AP tracterory. Biplanar projections were used to confirm position. Aspiration was confirmed to be negative for CSF and/or blood. A 1-2 ml. volume of Isovue-250 was injected and flow of contrast was noted at each level. Radiographs were obtained for documentation purposes.  ? ?After attaining the  desired flow of contrast documented above, a 0.5 to 1.0 ml test dose of 0.25% Marcaine was injected into each respective transforaminal space.  The patient was observed for 90 seconds post injection.  After no sensory deficits were reported, and normal lower extremity motor function was noted,   the above injectate was administered so that equal amounts of the injectate were placed at each foramen (level) into the transforaminal epidural space. ? ? ?Additional Comments:  ?Patient tolerated the procedure very well.  At the end of the procedure when done he did start to have some vasovagal type symptoms where he was hot and sweaty.  Subsequently he felt really nauseated and did actually vomit at that point.  He recovered well low and the recovery area and was discharged without any issues.  I did have a long talk with him about vasovagal type issues.  In the future with any injections would suggest preprocedure Valium. ?Dressing: 2 x 2 sterile gauze and Band-Aid ?  ? ?Post-procedure details: ?Patient was observed during the procedure. ?Post-procedure instructions were reviewed. ? ?Patient left the clinic in stable condition. ? ?

## 2022-02-21 ENCOUNTER — Telehealth: Payer: Self-pay | Admitting: Orthopaedic Surgery

## 2022-02-21 NOTE — Telephone Encounter (Signed)
I spoke with patient. He had lumbar ESI with Dr. Alvester Morin on 02/16/2022. He states that he has noticed that his legs feel weak now and his knees seem "wobbly". He also has some numbness in one of the knees. He questioned whether this is normal and will eventually go away? These are new symptoms post ESI.   ? ?Please advise. ?

## 2022-02-21 NOTE — Telephone Encounter (Signed)
Pt is calling--states his knees feel weak--like jello --Please call the pt  ?

## 2022-02-22 ENCOUNTER — Encounter: Payer: Self-pay | Admitting: Nurse Practitioner

## 2022-02-22 NOTE — Telephone Encounter (Signed)
noted 

## 2022-03-01 ENCOUNTER — Encounter: Payer: Self-pay | Admitting: Orthopaedic Surgery

## 2022-03-01 ENCOUNTER — Ambulatory Visit (INDEPENDENT_AMBULATORY_CARE_PROVIDER_SITE_OTHER): Payer: No Typology Code available for payment source | Admitting: Orthopaedic Surgery

## 2022-03-01 ENCOUNTER — Encounter: Payer: Self-pay | Admitting: Radiology

## 2022-03-01 VITALS — BP 119/87 | HR 62 | Ht 68.0 in | Wt 191.0 lb

## 2022-03-01 DIAGNOSIS — Z9889 Other specified postprocedural states: Secondary | ICD-10-CM | POA: Diagnosis not present

## 2022-03-01 NOTE — Progress Notes (Signed)
? ?Office Visit Note ?  ?Patient: Charles RinneWalter R Woodside Jr.           ?Date of Birth: 06/23/1971           ?MRN: [id number] ?Visit Date: 03/01/2022 ?             ?Requested by: Argentina PonderLlc, [facility] ?[address] ?[place],  [place] [id number] ?PCP: Argentina PonderLlc, [facility] ? ? ?Assessment & Plan: ?Visit Diagnoses:  ?1. S/P lumbar microdiscectomy   ? ? ?Plan: Work slip given patient can resume regular work on 03/14/2022.  Patient's already been assigned an impairment rating.  Patient is released for work without restrictions.  He states his job has told him he could reapply.  I will check him back again on an as-needed basis. ? ?Follow-Up Instructions: No follow-ups on file.  ? ?Orders:  ?No orders of the defined types were placed in this encounter. ? ?No orders of the defined types were placed in this encounter. ? ? ? ? Procedures: ?No procedures performed ? ? ?Clinical Data: ?No additional findings. ? ? ?Subjective: ?Chief Complaint  ?Patient presents with  ? Lower Back - Follow-up, Pain  ? ? ?HPI 51 year old male returns post lumbar epidural 02/15/2022 with improvement.  He can stand longer walk better sitting better.  He states sometimes his knees feel like they are spongy.  Right L3-4 microdiscectomy and right L2 partial laminectomy 02/22/2021. ? ?Today's visit we reviewed previous MRI scan which shows some disc desiccation with mild bulgingWith disc dehydration at L3-4 L4-5 and L5-S1. ? ?Review of Systems all the systems are noncontributory to HPI. ? ? ?Objective: ?Vital Signs: BP 119/87   Pulse 62   Ht 5\' 8"  (1.727 m)   Wt 191 lb (86.6 kg)   BMI 29.04 kg/m?  ? ?Physical Exam ?Constitutional:   ?   Appearance: He is well-developed.  ?HENT:  ?   Head: Normocephalic and atraumatic.  ?   Right Ear: External ear normal.  ?   Left Ear: External ear normal.  ?Eyes:  ?   Pupils: Pupils are equal, round, and reactive to light.  ?Neck:  ?   Thyroid: No thyromegaly.  ?   Trachea: No tracheal deviation.   ?Cardiovascular:  ?   Rate and Rhythm: Normal rate.  ?Pulmonary:  ?   Effort: Pulmonary effort is normal.  ?   Breath sounds: No wheezing.  ?Abdominal:  ?   General: Bowel sounds are normal.  ?   Palpations: Abdomen is soft.  ?Musculoskeletal:  ?   Cervical back: Neck supple.  ?Skin: ?   General: Skin is warm and dry.  ?   Capillary Refill: Capillary refill takes less than 2 seconds.  ?Neurological:  ?   Mental Status: He is alert and oriented to person, place, and time.  ?Psychiatric:     ?   Behavior: Behavior normal.     ?   Thought Content: Thought content normal.     ?   Judgment: Judgment normal.  ? ? ?Ortho Exam patient gets sitting standing comfortably.  He is amatory heel-toe gait.  No atrophy of the lower extremities well-healed lumbar incision. ? ?Specialty Comments:  ?Lumbar Spine MRI ? ?FINDINGS:  ? ?Prior right laminotomy or laminectomy at L3-4 (no preoperative imaging available for comparison).  ? ?Degenerative disc disease (DDD) and facet arthropathy (see below).  ? ?Vertebral alignment: Straightening of the normal lumbar lordosis. No significant subluxation.  ? ?Marrow signal: Mild  mixed type I and type II discogenic marrow signal changes posteriorly at L3-L4.  ? ?Vertebral body heights: Normal.  ? ?Conus medullaris: Terminates at a normal level (mid L2). Normal in size, contour, and signal intensity.  ? ?Individual disc levels:  ? ?T12-L1 (sagittal images only): Normal.  ?L1-2 (sagittal images only): Normal.  ?L2-3: Normal.  ? ?L3-4: Prior right laminotomy or laminectomy. Moderate DDD, including a moderate circumferential disc bulge and a moderate-sized, broad-based posterolateral disc herniation (age-indeterminate) that extends from the left subarticular zone rightward through the central and right subarticular zones and into the right foraminal zone and medial right extraforaminal zone. Mild bilateral facet arthropathy. Moderately severe right subarticular zone stenosis. Moderate central zone  and left subarticular zone stenosis. Moderately severe right neural foraminal stenosis with impingement on the exiting right L3 nerve root. Mild left neural foraminal stenosis. Expected enhancing extradural scar tissue on the right.  ? ?L4-5: Mild DDD. Small central zone and left subarticular zone disc extrusion (age-indeterminate) with caudal migration approaching mid way up behind the L4 vertebral body, in the left subarticular zone. Minimal bilateral facet arthrosis. Moderate left subarticular zone stenosis with at least mild impingement of the descending left L5 nerve root. Moderate left neural foraminal stenosis. Mild central zone, right subarticular zone, and right neural foraminal stenosis.  ? ?L5-S1: Mild DDD. Small right subarticular zone disc extrusion (age-indeterminate) with mild caudal migration. Mild bilateral facet arthropathy. Mild right subarticular zone stenosis with abutment and slight posterior displacement of the descending right S1 nerve root. No significant central zone or left subarticular zone stenosis. Mild-to-moderate bilateral neural foraminal stenosis.  ? ?Enhancement: No abnormal leptomeningeal enhancement.  ? ? ?IMPRESSION:  ? ?L3-4: Prior right laminotomy or laminectomy. Moderate DDD, including a moderate circumferential disc bulge and a moderate-sized, broad-based posterolateral disc herniation (age-indeterminate) that extends from the left subarticular zone rightward through the central and right subarticular zones and into the right foraminal zone and medial right extraforaminal zone. Mild bilateral facet arthropathy. Moderately severe right subarticular zone stenosis. Moderate central zone and left subarticular zone stenosis. Moderately severe right neural foraminal stenosis with impingement on the exiting right L3 nerve root. Mild left neural foraminal stenosis. Expected enhancing extradural scar tissue on the right.  ? ?L4-5: Mild DDD. Small central zone and left subarticular  zone disc extrusion (age-indeterminate) with caudal migration approaching mid way up behind the L4 vertebral body, in the left subarticular zone. Minimal bilateral facet arthrosis. Moderate left subarticular zone stenosis with at least mild impingement of the descending left L5 nerve root. Moderate left neural foraminal stenosis. Mild central zone, right subarticular zone, and right neural foraminal stenosis.  ? ?L5-S1: Mild DDD. Small right subarticular zone disc extrusion (age-indeterminate) with mild caudal migration. Mild bilateral facet arthropathy. Mild right subarticular zone stenosis with abutment and slight posterior displacement of the descending right S1 nerve root. No significant central zone or left subarticular zone stenosis. Mild-to-moderate bilateral neural foraminal stenosis.  ? ?Electronically Signed by: [place] Holder on 11/29/2021 10:06 AM ? ?Imaging: ?No results found. ? ? ?PMFS History: ?Patient Active Problem List  ? Diagnosis Date Noted  ? S/P lumbar microdiscectomy 02/22/2021  ? Lumbar herniated disc 01/13/2021  ? ?Past Medical History:  ?Diagnosis Date  ? Distal radius fracture, right   ? Enlarged prostate   ? Medical history non-contributory   ?  ?Family History  ?Problem Relation Age of Onset  ? Prostate cancer Maternal Grandfather   ? Breast cancer Mother   ?  ?Past  Surgical History:  ?Procedure Laterality Date  ? CARPAL TUNNEL RELEASE Right 06/22/2016  ? Procedure: CARPAL TUNNEL RELEASE,right;  Surgeon: Dairl Ponder, MD;  Location: [facility];  Service: Orthopedics;  Laterality: Right;  ? COLONOSCOPY    ? HERNIA REPAIR    ? as baby  ? LUMBAR LAMINECTOMY N/A 02/22/2021  ? Procedure: RIGHT L3-4 MICRODISCECTOMY,  Right L2 Partial Laminectomy.;  Surgeon: Eldred Manges, MD;  Location: [facility];  Service: Orthopedics;  Laterality: N/A;  ? OPEN REDUCTION INTERNAL FIXATION (ORIF) DISTAL RADIAL FRACTURE Right 06/22/2016  ? Procedure: OPEN REDUCTION INTERNAL FIXATION (ORIF) DISTAL RADIAL  FRACTURE,right;  Surgeon: Dairl Ponder, MD;  Location: [facility];  Service: Orthopedics;  Laterality: Right;  Axillary block  ? ?Social History  ? ?Occupational History  ? Occupation: truck Hospital doctor  ?T

## 2022-03-07 ENCOUNTER — Telehealth: Payer: Self-pay | Admitting: Radiology

## 2022-03-07 NOTE — Telephone Encounter (Signed)
Dr. Warner Mccreedy looks like two work notes were entered, one to return with no restrictions and one to return with light restrictions. OK to send the note with no restrictions? ?

## 2022-03-07 NOTE — Telephone Encounter (Signed)
Please see response from Dr. Lorin Mercy. Two notes were entered at office visit, one stating no restrictions. Please send that one.  ?Thank you. ? ?

## 2022-03-07 NOTE — Telephone Encounter (Signed)
Please see below message from Work Comp Case Production designer, theatre/television/film and advise. ? ?I just reviewed the 03/01/22 medical notes and work status note. In the note, it states the patient is released from care and  can return to work without restrictions on 03/14/22, but the work note states he can return to work on [employer] duty restrictions. Can you please have Dr. Ophelia Charter clarify the patient's work status and provide an updated work note to reflect these changes.  Once completed please forward the updated work status note.  ? ?Thank you,  ?   ?Townsend Roger, BSN RN  Telephonic Case Manager  ?[employer]  ?DIRECT [phone]  FAX [phone]  ? [email or web address]  ?

## 2022-03-09 ENCOUNTER — Encounter: Payer: Self-pay | Admitting: Nurse Practitioner

## 2022-03-09 ENCOUNTER — Ambulatory Visit: Payer: PRIVATE HEALTH INSURANCE | Admitting: Nurse Practitioner

## 2022-03-09 VITALS — BP 118/70 | HR 65 | Ht 68.0 in | Wt 194.2 lb

## 2022-03-09 DIAGNOSIS — R1310 Dysphagia, unspecified: Secondary | ICD-10-CM | POA: Diagnosis not present

## 2022-03-09 MED ORDER — OMEPRAZOLE 20 MG PO CPDR: 20.0000 mg | DELAYED_RELEASE_CAPSULE | Freq: Every day | ORAL | 1 refills | Status: DC

## 2022-03-09 NOTE — Progress Notes (Signed)
? ? ?Assessment  ? ?Patient profile:  ?Charles Delgado. is a health 51 y.o. male known to Dr. Lavon Paganini.  He has not been seen here since colonoscopy in early 2019.  His PCP referred him for a dysphagia.  ? ?Dysphagia, more transfer in nature.  ?Intermittently he gets buildup of thick saliva / phlegm in mouth and cannot transfer food bolus. He adamantly denies esophageal dysphagia. No problems with liquids. He describes a "citric" acid taste in his mouth. Not clear if sinus drainage into mouth may be contributing. Regurgitation of acid contributing?     ? ? ?Plan  ? ?Unusual presentation. Though he doesn't described esophageal dysphagia I would like to start with a barium swallow with tablet ?Given description of " citric" acid taste in mouth will try short course of PPI. ?Will contact him with esophagram results and to see how things are going on PPI  ? ? ? ?History of Present Illness  ? ?Chief complaint: swallowing problems ? ?Owenn was seen in 2018 for evaluation of Hemoccult positive stool and he underwent a colonoscopy. No polyps or cancers found. He was advised to have a 10 year follow up colonoscopy  ? ?Interval History :  ?He has had intermittent "swallowing problems" since March. He has a "citric" acid taste in his mouth. Sometimes gets a build up of thick saliva in his mouth ( especially when eating) making it hard to swallow solids no matter how much he chews the food. Once the bolus of food does pass from his mouth into esophagus then he is okay. He doesn't feel like food  gets hung up in his esophagus. No heartburn .  ? ?Separate from above Aydon has been having dry heaves which often leads to vomiting vrs hacking. This can happen during the day but it is especially common when he wakes [place]. He brings up phlegm and  "citrus acid" tasting mucous.   He has no preceding nausea.       ? ? ?Previous Labs / Imaging:: ? ?  Latest Ref Rng & Units 02/22/2021  ? 12:00 PM 05/03/2016  ?  6:15 PM  ?CBC  ?WBC 4.0 -  10.5 K/uL 5.0   10.7    ?Hemoglobin 13.0 - 17.0 g/dL 79.8   92.1    ?Hematocrit 39.0 - 52.0 % 45.3   48.1    ?Platelets 150 - 400 K/uL 274   269    ? ? ?Lab Results  ?Component Value Date  ? LIPASE 23 05/03/2016  ? ? ?  Latest Ref Rng & Units 02/22/2021  ? 12:00 PM 05/03/2016  ?  6:15 PM  ?CMP  ?Glucose 70 - 99 mg/dL 99   96    ?BUN 6 - 20 mg/dL 13   14    ?Creatinine 0.61 - 1.24 mg/dL 1.94   1.74    ?Sodium 135 - 145 mmol/L 142   137    ?Potassium 3.5 - 5.1 mmol/L 4.4   4.0    ?Chloride 98 - 111 mmol/L 108   100    ?CO2 22 - 32 mmol/L 25   29    ?Calcium 8.9 - 10.3 mg/dL 9.7   9.6    ?Total Protein 6.5 - 8.1 g/dL 7.1   8.1    ?Total Bilirubin 0.3 - 1.2 mg/dL 0.7   1.0    ?Alkaline Phos 38 - 126 U/L 59   65    ?AST 15 - 41 U/L 19   20    ?  ALT 0 - 44 U/L 19   20    ? ? ? ?Previous GI Evaluations  ? ?Endoscopies: ?February 2019 colonoscopy for GI bleeding ?- Erythematous mucosa in the terminal ileum. Biopsied. ?- Severe diverticulosis in the sigmoid colon and in the descending colon. Peri-diverticular erythema was seen. Biopsied. ?- Mild diverticulosis in the transverse colon and in the ascending colon. Erythematous mucosa in the rectum.  Biopsied ? ?Surgical [P], terminal ileum bc - inflammation noted ?- FOCALLY ACTIVE NON-SPECIFIC ILEITIS. ?- NEGATIVE FOR FEATURES OF CHRONICITY OR GRANULOMAS. SEE NOTE. ?2. Surgical [P], sigmoid bx ?- COLONIC MUCOSA WITH NO SPECIFIC HISTOPATHOLOGIC CHANGES. ?- NEGATIVE FOR ACUTE INFLAMMATION, FEATURES OF CHRONICITY OR GRANULOMAS. ?3. Surgical [P], rectum bx ?- COLONIC MUCOSA WITH NO SPECIFIC HISTOPATHOLOGIC CHANGES. ?- NEGATIVE FOR ACUTE INFLAMMATION, FEATURES OF CHRONICITY OR GRANULOMAS. ? ? ?Past Medical History:  ?Diagnosis Date  ? Distal radius fracture, right   ? Enlarged prostate   ? Medical history non-contributory   ? ?Past Surgical History:  ?Procedure Laterality Date  ? CARPAL TUNNEL RELEASE Right 06/22/2016  ? Procedure: CARPAL TUNNEL RELEASE,right;  Surgeon: Dairl PonderMatthew Weingold,  MD;  Location: [facility];  Service: Orthopedics;  Laterality: Right;  ? COLONOSCOPY    ? HERNIA REPAIR    ? as baby  ? LUMBAR LAMINECTOMY N/A 02/22/2021  ? Procedure: RIGHT L3-4 MICRODISCECTOMY,  Right L2 Partial Laminectomy.;  Surgeon: Eldred MangesYates, Mark C, MD;  Location: [facility];  Service: Orthopedics;  Laterality: N/A;  ? OPEN REDUCTION INTERNAL FIXATION (ORIF) DISTAL RADIAL FRACTURE Right 06/22/2016  ? Procedure: OPEN REDUCTION INTERNAL FIXATION (ORIF) DISTAL RADIAL FRACTURE,right;  Surgeon: Dairl PonderMatthew Weingold, MD;  Location: [facility];  Service: Orthopedics;  Laterality: Right;  Axillary block  ? ?Family History  ?Problem Relation Age of Onset  ? Breast cancer Mother   ? Prostate cancer Maternal Grandfather   ? Colon cancer Paternal Grandfather   ? Stomach cancer Neg Hx   ? Esophageal cancer Neg Hx   ? Colon polyps Neg Hx   ? ?Social History  ? ?Tobacco Use  ? Smoking status: Former  ?  Packs/day: 0.50  ?  Types: Cigarettes  ?  Quit date: 12/22/2020  ?  Years since quitting: 1.2  ? Smokeless tobacco: Never  ?Vaping Use  ? Vaping Use: Former  ?Substance Use Topics  ? Alcohol use: No  ? Drug use: No  ? ?No current outpatient medications on file.  ? ?No current facility-administered medications for this visit.  ? ?Allergies  ?Allergen Reactions  ? Penicillins Hives  ?  Has patient had a PCN reaction causing immediate rash, facial/tongue/throat swelling, SOB or lightheadedness with hypotension: Yes ?Has patient had a PCN reaction causing severe rash involving mucus membranes or skin necrosis: No ?Has patient had a PCN reaction that required hospitalization No ?Has patient had a PCN reaction occurring within the last 10 years: No ?If all of the above answers are "NO", then may proceed with Cephalosporin use. ?  ? ? ? ?Review of Systems: ?All systems reviewed and negative except where noted in HPI.  ? ?Physical Exam  ? ?Wt Readings from Last 3 Encounters:  ?03/09/22 194 lb 3.2 oz (88.1 kg)  ?03/01/22  191 lb (86.6 kg)  ?12/17/21 191 lb (86.6 kg)  ? ? ?BP 118/70   Pulse 65   Ht 5\' 8"  (1.727 m)   Wt 194 lb 3.2 oz (88.1 kg)   SpO2 97%   BMI 29.53 kg/m?  ?Constitutional:  Generally well appearing male in no acute distress. ?Psychiatric: Pleasant. Normal mood and affect. Behavior is normal. ?EENT: Pupils normal.  Conjunctivae are normal. No scleral icterus. ?Neck supple.  ?Cardiovascular: Normal rate, regular rhythm. No edema ?Pulmonary/chest: Effort normal and breath sounds normal. No wheezing, rales or rhonchi. ?Abdominal: Soft, nondistended, nontender. Bowel sounds active throughout. There are no masses palpable. No hepatomegaly. ?Neurological: Alert and oriented to person place and time. ?Skin: Skin is warm and dry. No rashes noted. ? ?Willette Cluster, NP  03/09/2022, 10:39 AM ? ?Cc:  ?Referring Provider ?Scott Long, PA-C ? ? ? ? ? ? ? ?

## 2022-03-09 NOTE — Patient Instructions (Addendum)
You have been scheduled for a Barium Esophogram at [facility] (1st floor of the hospital) on 03/18/22 at 11:00am. Please arrive 15 minutes prior to your appointment for registration. Make certain not to have anything to eat or drink 3 hours prior to your test. If you need to reschedule for any reason, please contact radiology at [phone] to do so. ?__________________________________________________________________ ?A barium swallow is an examination that concentrates on views of the esophagus. This tends to be a double contrast exam (barium and two liquids which, when combined, create a gas to distend the wall of the oesophagus) or single contrast (non-ionic iodine based). The study is usually tailored to your symptoms so a good history is essential. Attention is paid during the study to the form, structure and configuration of the esophagus, looking for functional disorders (such as aspiration, dysphagia, achalasia, motility and reflux) ?EXAMINATION ?You may be asked to change into a gown, depending on the type of swallow being performed. A radiologist and radiographer will perform the procedure. The radiologist will advise you of the ?type of contrast selected for your procedure and direct you during the exam. You will be asked to stand, sit or lie in several different positions and to hold a small amount of fluid in your mouth before being asked to swallow while the imaging is performed .In some instances you may be asked to swallow barium coated marshmallows to assess the motility of a solid food bolus. ?The exam can be recorded as a digital or video fluoroscopy procedure. ?POST PROCEDURE ?It will take 1-2 days for the barium to pass through your system. To facilitate this, it is important, unless otherwise directed, to increase your fluids for the next 24-48hrs and to resume your normal diet.  ?This test typically takes about 30 minutes to  perform. ?_____________________________________________________________________________ ? ?We have sent the following medications to your pharmacy for you to pick up at your convenience: ?Omeprazole -Take 1 capsule by mouth 30 mins prior to lunch everyday.  ? ?If you are age 17 or younger, your body mass index should be between 19-25. Your Body mass index is 29.53 kg/m?Marland Kitchen If this is out of the aformentioned range listed, please consider follow up with your Primary Care Provider.  ? ?________________________________________________________ ? ?The [facility] providers would like to encourage you to use [facility] to communicate with providers for non-urgent requests or questions.  Due to long hold times on the telephone, sending your provider a message by [facility] may be a faster and more efficient way to get a response.  Please allow 48 business hours for a response.  Please remember that this is for non-urgent requests.  ? ?Thank you for choosing me and [facility]. ? ?Willette Cluster NP  ? ? ? ?

## 2022-03-17 NOTE — Progress Notes (Signed)
Reviewed and agree with documentation and assessment and plan. K. Veena Ramzey Petrovic , MD   

## 2022-03-18 ENCOUNTER — Ambulatory Visit (HOSPITAL_COMMUNITY)
Admission: RE | Admit: 2022-03-18 | Discharge: 2022-03-18 | Disposition: A | Discharge status: Home / Self Care | Payer: PRIVATE HEALTH INSURANCE | Source: Ambulatory Visit | Attending: Nurse Practitioner | Admitting: Nurse Practitioner

## 2022-03-18 DIAGNOSIS — R1310 Dysphagia, unspecified: Secondary | ICD-10-CM | POA: Insufficient documentation

## 2022-03-31 ENCOUNTER — Telehealth: Payer: Self-pay | Admitting: Orthopaedic Surgery

## 2022-03-31 NOTE — Telephone Encounter (Signed)
Retrieved vm from TZ. 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 I faxed the 25R and last ov note with the work note [phone]/ph [phone]

## 2022-04-01 ENCOUNTER — Other Ambulatory Visit: Payer: Self-pay | Admitting: Nurse Practitioner

## 2022-04-05 ENCOUNTER — Telehealth: Payer: Self-pay | Admitting: Orthopaedic Surgery

## 2022-04-05 NOTE — Telephone Encounter (Signed)
Pt called requesting a disc of x ray. Please call pt when ready for pick up. Pt phone number is [phone].

## 2022-04-05 NOTE — Telephone Encounter (Signed)
Patient aware CD is ready for pickup at front desk.  

## 2022-04-14 ENCOUNTER — Encounter: Payer: Self-pay | Admitting: Nurse Practitioner

## 2022-04-14 ENCOUNTER — Ambulatory Visit: Payer: PRIVATE HEALTH INSURANCE | Admitting: Nurse Practitioner

## 2022-04-14 VITALS — BP 100/70 | HR 68 | Ht 68.0 in | Wt 193.0 lb

## 2022-04-14 DIAGNOSIS — K219 Gastro-esophageal reflux disease without esophagitis: Secondary | ICD-10-CM | POA: Diagnosis not present

## 2022-04-14 NOTE — Progress Notes (Signed)
Chief Complaint: follow up. Feeling better   Assessment &  Plan   #Probable GERD.  Recently seen with complaints of thick saliva in mouth and mild transfer dysphagia.  Normal barium swallow.  Symptoms significantly improved on omeprazole. Ran out of Omeprazole two days ago and so far so doing okay other than occasional heartburn and "queasiness"  He will see how things go off PPI for now.  Should he get recurrent symptoms he will try Pepcid OTC and then contact our office for a follow-up appointment Antireflux literature given   HPI   Charles Delgado is known to Dr. Lavon Paganini.  He had a positive Hemoccult stool in 2018 and was referred here for a colonoscopy which was normal.  10-year follow-up colonoscopy recommended.  I saw him mid May with complaints of thick saliva in his throat and some difficulty with swallowing.  He had no esophageal dysphagia but was describing more of the transfer dysphagia.  Barium swallow was obtained and he was started empirically on PPI therapy   Interval History:  Barium swallow showed no definite abnormalities of the esophagus. He has had significant improvement on Omeprazole. Thick saliva has resolved. No further problems swallowing. He does get occasional heartburn and has occasional transient queasiness. He ran out of Omeprazole two days ago and is still doing okay.   Imaging   Barium swallow with tablet FINDINGS: No definite mass or stricture is noted in the esophagus. No hiatal hernia or reflux is noted. 13 mm barium tablet passed through esophagus and into stomach without difficulty or delay.  IMPRESSION: No definite abnormality seen in the esophagus.    Past Medical History:  Diagnosis Date   Distal radius fracture, right    Enlarged prostate    Medical history non-contributory     Past Surgical History:  Procedure Laterality Date   CARPAL TUNNEL RELEASE Right 06/22/2016   Procedure: CARPAL TUNNEL RELEASE,right;  Surgeon: Dairl Ponder, MD;   Location: [facility];  Service: Orthopedics;  Laterality: Right;   COLONOSCOPY     HERNIA REPAIR     as baby   LUMBAR LAMINECTOMY N/A 02/22/2021   Procedure: RIGHT L3-4 MICRODISCECTOMY,  Right L2 Partial Laminectomy.;  Surgeon: Eldred Manges, MD;  Location: [facility];  Service: Orthopedics;  Laterality: N/A;   OPEN REDUCTION INTERNAL FIXATION (ORIF) DISTAL RADIAL FRACTURE Right 06/22/2016   Procedure: OPEN REDUCTION INTERNAL FIXATION (ORIF) DISTAL RADIAL FRACTURE,right;  Surgeon: Dairl Ponder, MD;  Location: [facility];  Service: Orthopedics;  Laterality: Right;  Axillary block    Current Medications, Allergies, Family History and Social History were reviewed in [employer] record.     Current Outpatient Medications  Medication Sig Dispense Refill   omeprazole (PRILOSEC) 20 MG capsule Take 1 capsule (20 mg total) by mouth daily. 30 capsule 1   No current facility-administered medications for this visit.    Review of Systems: No chest pain. No shortness of breath. No urinary complaints.    Physical Exam  Wt Readings from Last 3 Encounters:  04/14/22 193 lb (87.5 kg)  03/09/22 194 lb 3.2 oz (88.1 kg)  03/01/22 191 lb (86.6 kg)    BP 100/70 (BP Location: Left Arm, Patient Position: Sitting, Cuff Size: Normal)   Pulse 68   Ht 5\' 8"  (1.727 m) Comment: height measured without shoes  Wt 193 lb (87.5 kg)   BMI 29.35 kg/m  Constitutional:  Generally well appearing male in no acute distress. Psychiatric: Pleasant. Normal  mood and affect. Behavior is normal. Neck supple.  Cardiovascular: Normal rate, regular rhythm. No edema Pulmonary/chest: Effort normal and breath sounds normal. No wheezing, rales or rhonchi. Abdominal: Soft, nondistended, nontender. Bowel sounds active throughout. There are no masses palpable. Neurological: Alert and oriented to person place and time.  Willette Cluster, NP  04/14/2022, 10:23 AM

## 2022-04-14 NOTE — Patient Instructions (Addendum)
Acid Reflux  --Avoid late meals / bedtime snacks.   --Avoid trigger foods ( foods which you know tend to aggravate you reflux symptoms). Some common trigger foods include spicy foods, fatty foods, acidic foods, chocolate and caffeine.  --Elevate the head of bed 6-8 inches on blocks or bricks. If not able to elevate the head of the bed consider purchasing a wedge pillow to sleep on.    --Weight reduction / maintain a healthy BMI ( body mass index) may be help with reflux symptoms  --Sometimes with the above mentioned "lifestyle changes" patients are able to reduce the amount of GERD medications they take. Our goal is to have you on the lowest effective dose of medication   It is okay to try and stay off reflux medication. If you get recurrent symptoms try Pepcid OTC once daily and then follow up in our office for an appointment.    If you are age 33 or older, your body mass index should be between 23-30. Your Body mass index is 29.35 kg/m. If this is out of the aforementioned range listed, please consider follow up with your Primary Care Provider.  If you are age 47 or younger, your body mass index should be between 19-25. Your Body mass index is 29.35 kg/m. If this is out of the aformentioned range listed, please consider follow up with your Primary Care Provider.   _____________________________________________________  The [facility] providers would like to encourage you to use [facility] to communicate with providers for non-urgent requests or questions.  Due to long hold times on the telephone, sending your provider a message by [facility] may be a faster and more efficient way to get a response.  Please allow 48 business hours for a response.  Please remember that this is for non-urgent requests.   Due to recent changes in healthcare laws, you may see the results of your imaging and laboratory studies on MyChart before your provider has had a chance to review them.  We understand that in some  cases there may be results that are confusing or concerning to you. Not all laboratory results come back in the same time frame and the provider may be waiting for multiple results in order to interpret others.  Please give [place] 48 hours in order for your provider to thoroughly review all the results before contacting the office for clarification of your results.    It was a pleasure to see you today!  Thank you for trusting me with your gastrointestinal care!    Willette Cluster, NP-C

## 2022-06-14 ENCOUNTER — Other Ambulatory Visit: Payer: Self-pay | Admitting: Nurse Practitioner

## 2022-06-28 ENCOUNTER — Other Ambulatory Visit: Payer: Self-pay | Admitting: Nurse Practitioner

## 2023-01-27 ENCOUNTER — Ambulatory Visit: Payer: Self-pay

## 2023-01-27 ENCOUNTER — Other Ambulatory Visit: Payer: Self-pay | Admitting: Nurse Practitioner

## 2023-01-27 DIAGNOSIS — M25572 Pain in left ankle and joints of left foot: Secondary | ICD-10-CM

## 2023-06-25 ENCOUNTER — Ambulatory Visit (HOSPITAL_COMMUNITY)
Admission: EM | Admit: 2023-06-25 | Discharge: 2023-06-25 | Disposition: A | Discharge status: Home / Self Care | Payer: BC Managed Care – PPO | Attending: Nurse Practitioner | Admitting: Nurse Practitioner

## 2023-06-25 ENCOUNTER — Encounter (HOSPITAL_COMMUNITY): Payer: Self-pay

## 2023-06-25 DIAGNOSIS — R21 Rash and other nonspecific skin eruption: Secondary | ICD-10-CM

## 2023-06-25 MED ORDER — TRIAMCINOLONE ACETONIDE 0.1 % EX OINT: 1.0000 | TOPICAL_OINTMENT | Freq: Two times a day (BID) | CUTANEOUS | 0 refills | Status: AC

## 2023-06-25 NOTE — Discharge Instructions (Addendum)
Start using the steroid ointment to the raised, itchy areas twice daily.  Continue using moisturizer over top of the ointment.  Avoid really hot showers, leave your skin a little bit moist prior to applying the moisturizer.  Seek care if the ointment has not helped significantly over the next 7 days.

## 2023-06-25 NOTE — ED Provider Notes (Signed)
[facility]    CSN: [id number] Arrival date & time: 06/25/23  1004      History   Chief Complaint Chief Complaint  Patient presents with   Rash    HPI Charles Delgado. is a 52 y.o. male.   Patient presents today for rash on his stomach and back in his right underarm that he noticed about 1 month ago.  Reports the areas are little bit raised and itchy and they tend to come and go.  No fevers or nausea/vomiting.  No recent change in any detergents, soaps, personal care products.  Reports he used some over-the-counter steroid cream without relief.  Reports he moisturizes daily with Vaseline.    Past Medical History:  Diagnosis Date   Distal radius fracture, right    Enlarged prostate    Medical history non-contributory     Patient Active Problem List   Diagnosis Date Noted   S/P lumbar microdiscectomy 02/22/2021   Lumbar herniated disc 01/13/2021    Past Surgical History:  Procedure Laterality Date   CARPAL TUNNEL RELEASE Right 06/22/2016   Procedure: CARPAL TUNNEL RELEASE,right;  Surgeon: Dairl Ponder, MD;  Location: [facility];  Service: Orthopedics;  Laterality: Right;   COLONOSCOPY     HERNIA REPAIR     as baby   LUMBAR LAMINECTOMY N/A 02/22/2021   Procedure: RIGHT L3-4 MICRODISCECTOMY,  Right L2 Partial Laminectomy.;  Surgeon: Eldred Manges, MD;  Location: [facility];  Service: Orthopedics;  Laterality: N/A;   OPEN REDUCTION INTERNAL FIXATION (ORIF) DISTAL RADIAL FRACTURE Right 06/22/2016   Procedure: OPEN REDUCTION INTERNAL FIXATION (ORIF) DISTAL RADIAL FRACTURE,right;  Surgeon: Dairl Ponder, MD;  Location: [facility];  Service: Orthopedics;  Laterality: Right;  Axillary block       Home Medications    Prior to Admission medications   Medication Sig Start Date End Date Taking? Authorizing Provider  triamcinolone ointment (KENALOG) 0.1 % Apply 1 Application topically 2 (two) times daily. Apply topically twice daily as  needed for dry skin.  Do not use for more than 14 days in a row. 06/25/23  Yes Valentino Nose, NP  omeprazole (PRILOSEC) 20 MG capsule TAKE 1 CAPSULE BY MOUTH EVERY DAY 06/29/22   Meredith Pel, NP    Family History Family History  Problem Relation Age of Onset   Breast cancer Mother    Prostate cancer Maternal Grandfather    Colon cancer Paternal Grandfather    Stomach cancer Neg Hx    Esophageal cancer Neg Hx    Colon polyps Neg Hx     Social History Social History   Tobacco Use   Smoking status: Former    Current packs/day: 0.00    Types: Cigarettes    Quit date: 12/22/2020    Years since quitting: 2.5   Smokeless tobacco: Never  Vaping Use   Vaping status: Former  Substance Use Topics   Alcohol use: No   Drug use: No     Allergies   Penicillins   Review of Systems Review of Systems Per HPI  Physical Exam Triage Vital Signs ED Triage Vitals  Encounter Vitals Group     BP 06/25/23 1032 128/89     Systolic BP Percentile --      Diastolic BP Percentile --      Pulse Rate 06/25/23 1032 66     Resp --      Temp 06/25/23 1032 98 F (36.7 C)  Temp Source 06/25/23 1032 Oral     SpO2 06/25/23 1032 97 %     Weight 06/25/23 1032 188 lb (85.3 kg)     Height 06/25/23 1032 5\' 8"  (1.727 m)     Head Circumference --      Peak Flow --      Pain Score 06/25/23 1030 0     Pain Loc --      Pain Education --      Exclude from Growth Chart --    No data found.  Updated Vital Signs BP 128/89 (BP Location: Right Arm)   Pulse 66   Temp 98 F (36.7 C) (Oral)   Ht 5\' 8"  (1.727 m)   Wt 188 lb (85.3 kg)   SpO2 97%   BMI 28.59 kg/m   Visual Acuity Right Eye Distance:   Left Eye Distance:   Bilateral Distance:    Right Eye Near:   Left Eye Near:    Bilateral Near:     Physical Exam Vitals and nursing note reviewed.  Constitutional:      General: He is not in acute distress.    Appearance: Normal appearance. He is not toxic-appearing.  HENT:      Head: Normocephalic and atraumatic.     Mouth/Throat:     Mouth: Mucous membranes are moist.  Pulmonary:     Effort: Pulmonary effort is normal. No respiratory distress.  Skin:    General: Skin is warm and dry.     Capillary Refill: Capillary refill takes less than 2 seconds.     Coloration: Skin is not jaundiced or pale.     Findings: Rash present.     Comments: Circular, plaque-like rash noted to right axilla.  Rash is hyperpigmented.  No warmth, active drainage.  No other appreciable rash on the trunk or back.  Skin appears dry diffusely.  Neurological:     Mental Status: He is alert and oriented to person, place, and time.  Psychiatric:        Behavior: Behavior is cooperative.      UC Treatments / Results  Labs (all labs ordered are listed, but only abnormal results are displayed) Labs Reviewed - No data to display  EKG   Radiology No results found.  Procedures Procedures (including critical care time)  Medications Ordered in UC Medications - No data to display  Initial Impression / Assessment and Plan / UC Course  I have reviewed the triage vital signs and the nursing notes.  Pertinent labs & imaging results that were available during my care of the patient were reviewed by me and considered in my medical decision making (see chart for details).   Patient is well-appearing, normotensive, afebrile, not tachycardic, not tachypneic, oxygenating well on room air.    1. Rash and nonspecific skin eruption Start corticosteroid ointment to the affected areas twice daily, continue moisturizer Follow-up with PCP if no improvement or worsening symptoms despite treatment  The patient was given the opportunity to ask questions.  All questions answered to their satisfaction.  The patient is in agreement to this plan.    Final Clinical Impressions(s) / UC Diagnoses   Final diagnoses:  Rash and nonspecific skin eruption     Discharge Instructions      Start using the  steroid ointment to the raised, itchy areas twice daily.  Continue using moisturizer over top of the ointment.  Avoid really hot showers, leave your skin a little bit moist prior to applying the  moisturizer.  Seek care if the ointment has not helped significantly over the next 7 days.    ED Prescriptions     Medication Sig Dispense Auth. Provider   triamcinolone ointment (KENALOG) 0.1 % Apply 1 Application topically 2 (two) times daily. Apply topically twice daily as needed for dry skin.  Do not use for more than 14 days in a row. 30 g Valentino Nose, NP      PDMP not reviewed this encounter.   Valentino Nose, NP 06/25/23 1328

## 2023-06-25 NOTE — ED Triage Notes (Signed)
Patient has a rash on the stomach and back. Patient states his wife noticed this a month and a half ago. Was on psoriasis otc lotion with no relief. No known spreading. No fever, slight tenderness at the rash site. No one with similar symptoms.

## 2023-06-26 ENCOUNTER — Ambulatory Visit (HOSPITAL_COMMUNITY): Payer: PRIVATE HEALTH INSURANCE

## 2023-10-26 ENCOUNTER — Other Ambulatory Visit: Payer: Self-pay | Admitting: Nurse Practitioner

## 2023-12-07 IMAGING — RF DG ESOPHAGUS
5 series · 15 of 20 positions shown · non-contrast
Comparison: None Available.

CLINICAL DATA: Dysphagia.

EXAM:
ESOPHOGRAM/BARIUM SWALLOW
TECHNIQUE: Single contrast examination was performed using  thin barium .
FLUOROSCOPY:
Radiation Exposure Index (as provided by the fluoroscopic device):
8.2 mGy Kerma

[Series 1: cp_standard · 3 of 28 frames shown (1 of 5)]
[frame 5/28]
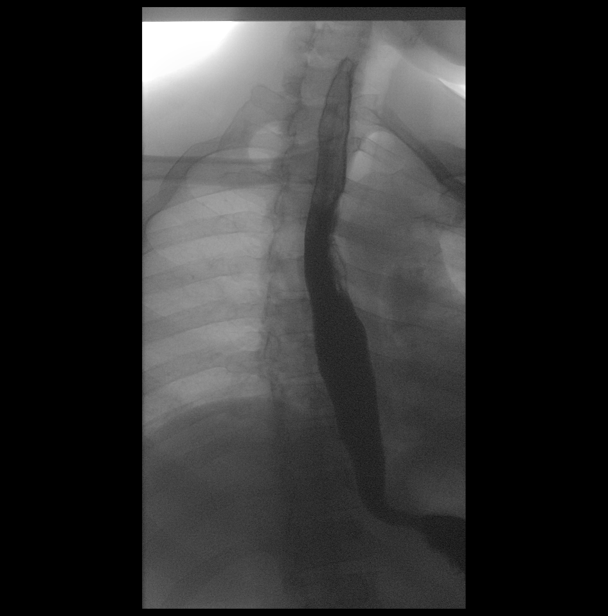
[frame 24/28]
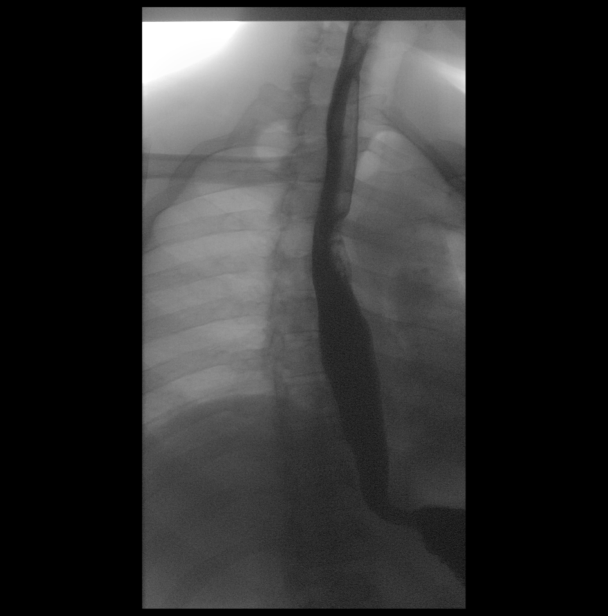
[frame 27/28]
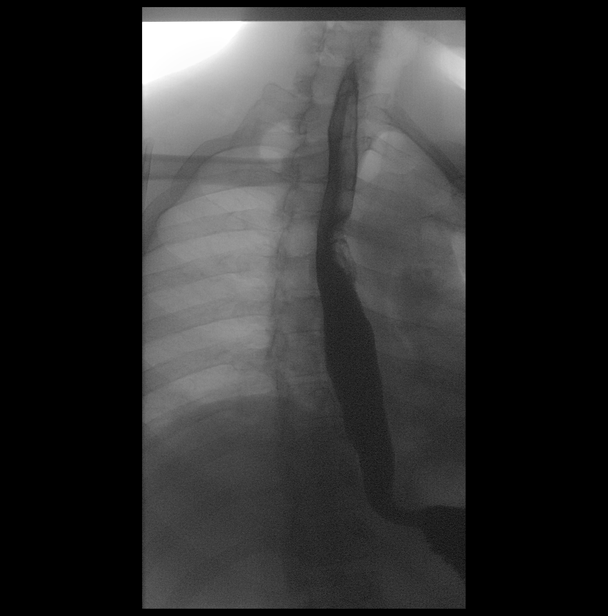

[Series 2: cp_standard · 3 of 58 frames shown (2 of 5)]
[frame 2/58]
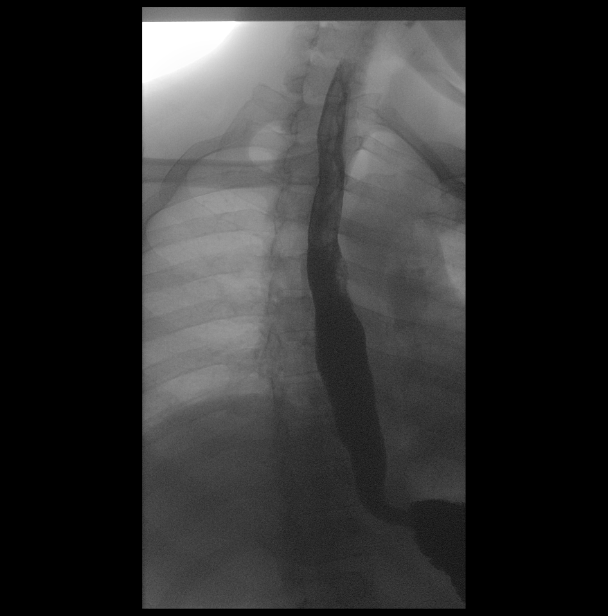
[frame 30/58]
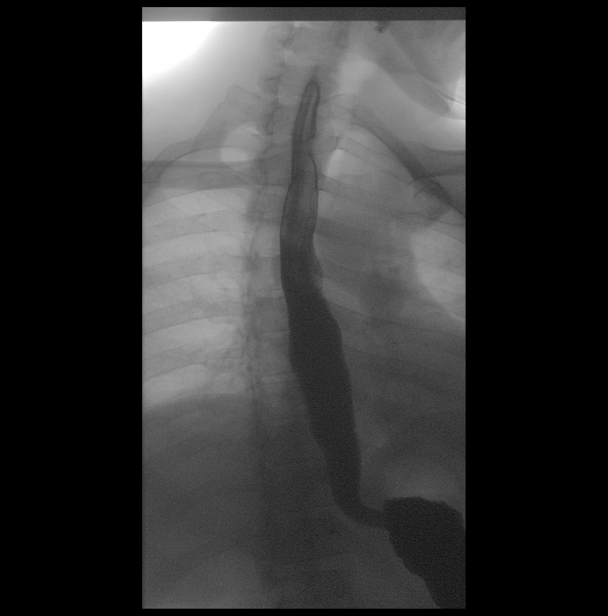
[frame 50/58]
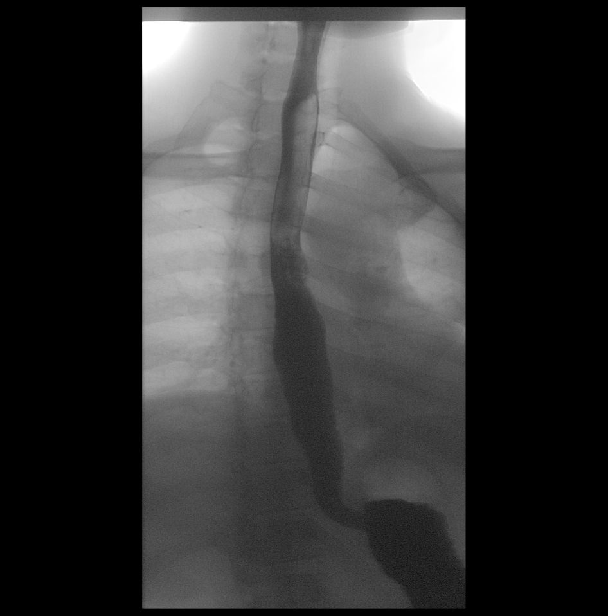

[Series 3: cp_standard · 3 of 41 frames shown (3 of 5)]
[frame 1/41]
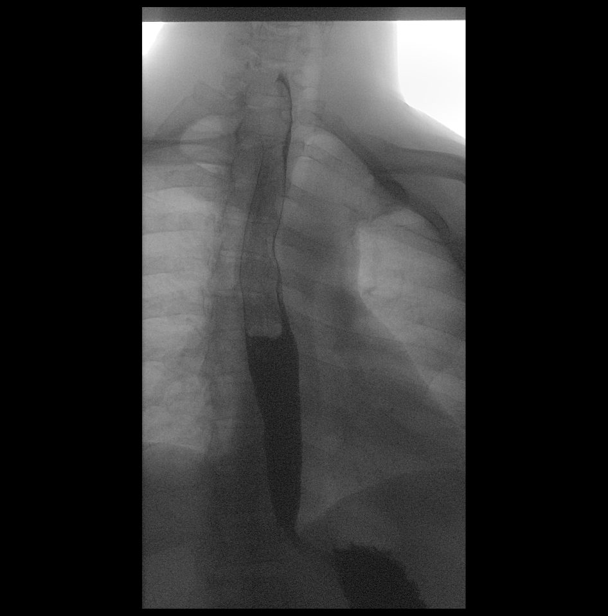
[frame 21/41]
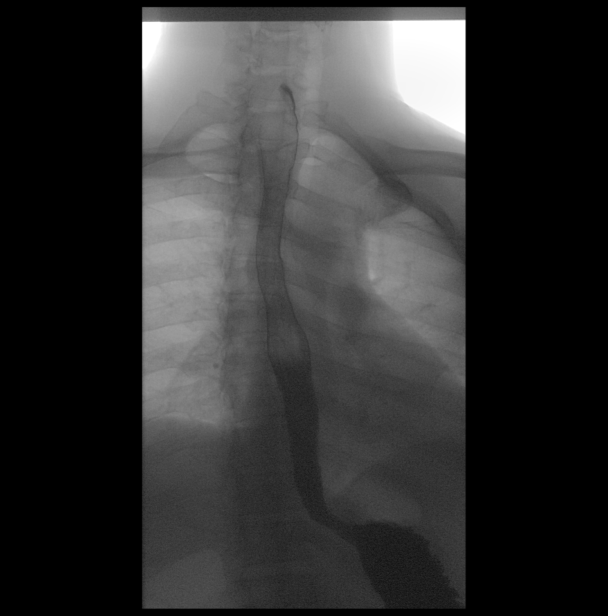
[frame 35/41]
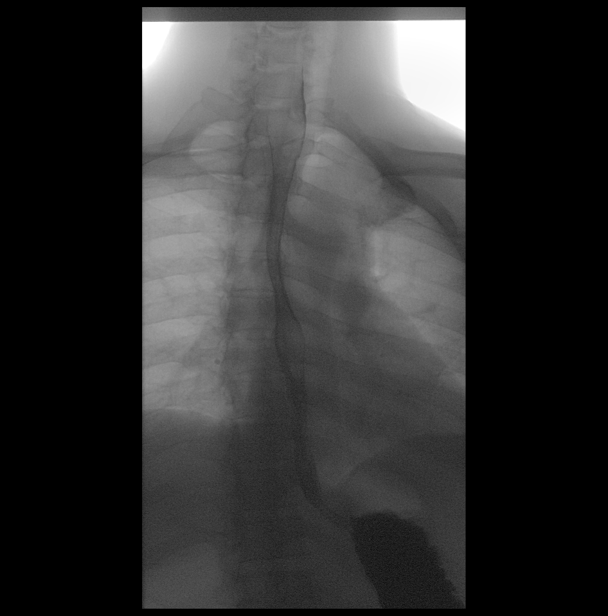

[Series 4: cp_standard · 3 of 149 frames shown (4 of 5)]
[frame 23/149]
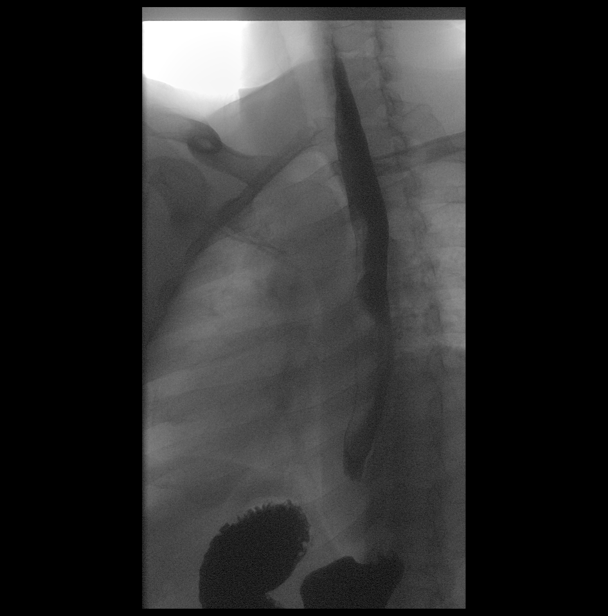
[frame 123/149]
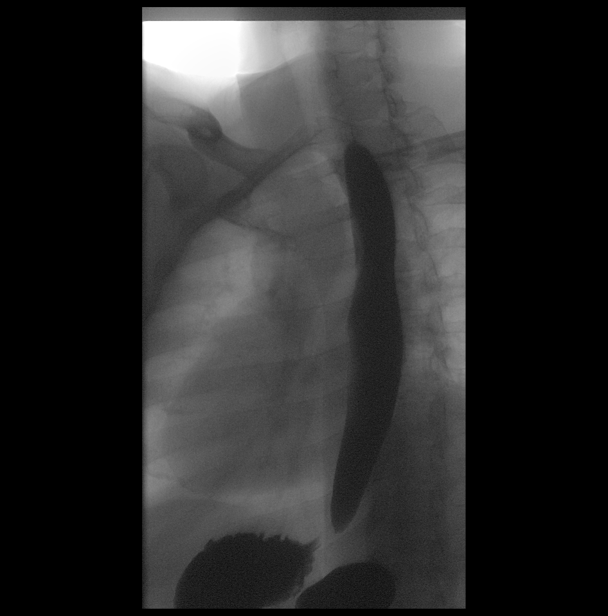
[frame 127/149]
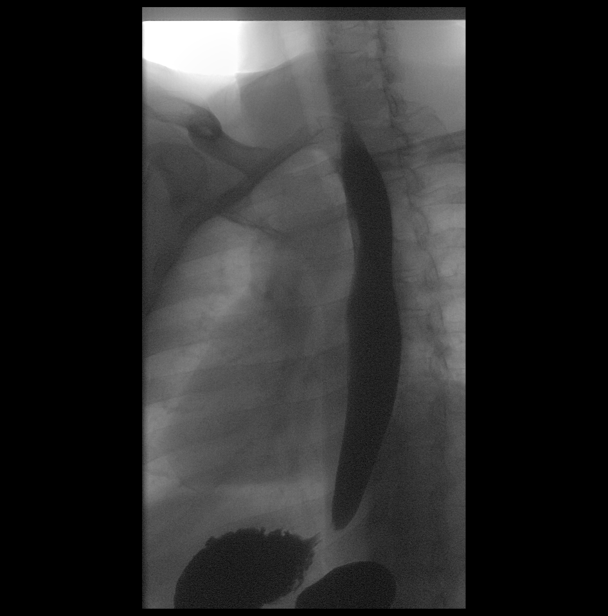

[Series 5: cp_standard · 3 of 167 frames shown (5 of 5)]
[frame 21/167]
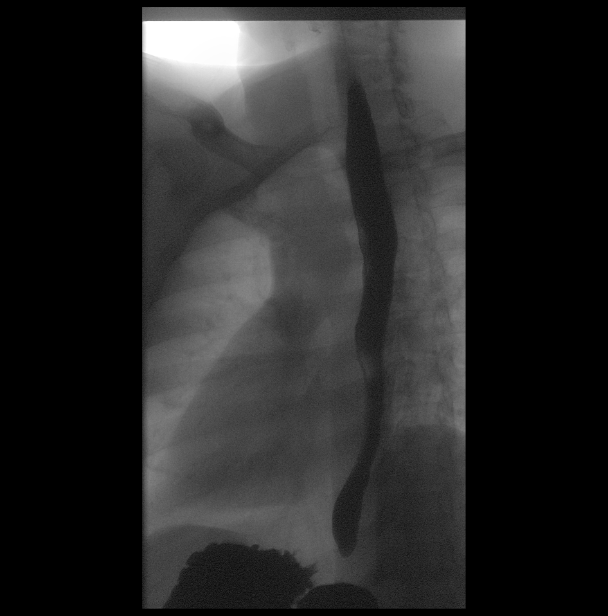
[frame 84/167]
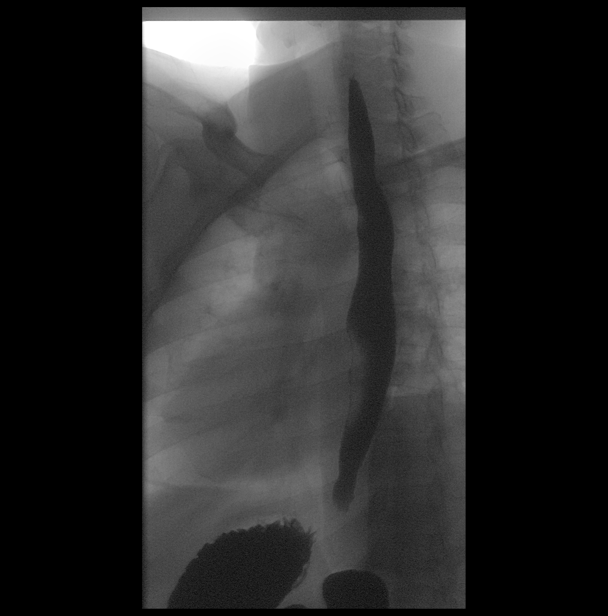
[frame 142/167]
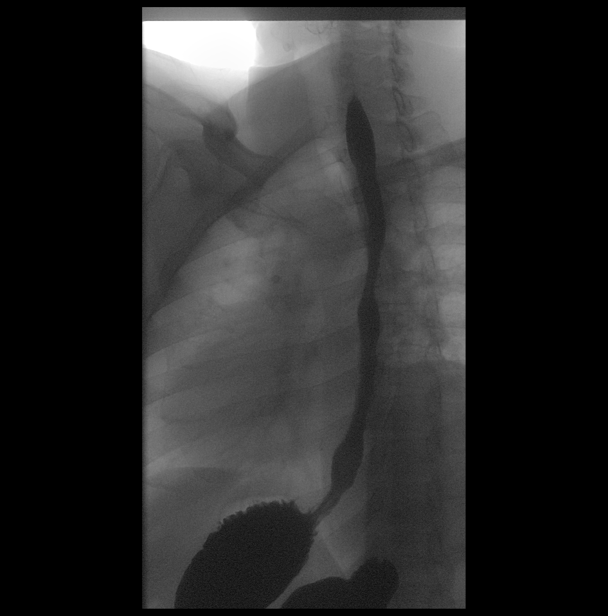

[15 of 20 positions shown; findings below may reference images not displayed]

FINDINGS: No definite mass or stricture is noted in the esophagus. No hiatal
hernia or reflux is noted. 13 mm barium tablet passed through
esophagus and into stomach without difficulty or delay.
IMPRESSION: No definite abnormality seen in the esophagus.

## 2023-12-29 ENCOUNTER — Other Ambulatory Visit: Payer: Self-pay | Admitting: Physician Assistant

## 2023-12-29 DIAGNOSIS — Z026 Encounter for examination for insurance purposes: Secondary | ICD-10-CM

## 2023-12-29 DIAGNOSIS — M24129 Other articular cartilage disorders, unspecified elbow: Secondary | ICD-10-CM

## 2024-01-02 ENCOUNTER — Ambulatory Visit
Admission: RE | Admit: 2024-01-02 | Discharge: 2024-01-02 | Disposition: A | Discharge status: Home / Self Care | Payer: Worker's Compensation | Source: Ambulatory Visit | Attending: Physician Assistant | Admitting: Physician Assistant

## 2024-01-02 DIAGNOSIS — M24129 Other articular cartilage disorders, unspecified elbow: Secondary | ICD-10-CM

## 2024-01-02 DIAGNOSIS — Z026 Encounter for examination for insurance purposes: Secondary | ICD-10-CM

## 2024-01-08 ENCOUNTER — Other Ambulatory Visit

## 2024-01-10 ENCOUNTER — Other Ambulatory Visit: Payer: Self-pay | Admitting: Orthopedic Surgery

## 2024-01-10 DIAGNOSIS — S59901A Unspecified injury of right elbow, initial encounter: Secondary | ICD-10-CM

## 2024-01-29 ENCOUNTER — Other Ambulatory Visit: Payer: Self-pay

## 2024-01-29 ENCOUNTER — Other Ambulatory Visit
